# Patient Record
Sex: Female | Born: 1975 | Race: Black or African American | Hispanic: No | Marital: Single | Smoking: Former smoker
Health system: Southern US, Community
[De-identification: ages and names within clinical notes are randomized; demographics above are authoritative.]

## PROBLEM LIST (undated history)

## (undated) ENCOUNTER — Inpatient Hospital Stay (HOSPITAL_COMMUNITY): Payer: Self-pay

## (undated) DIAGNOSIS — Z8619 Personal history of other infectious and parasitic diseases: Secondary | ICD-10-CM

## (undated) DIAGNOSIS — F419 Anxiety disorder, unspecified: Secondary | ICD-10-CM

## (undated) DIAGNOSIS — D649 Anemia, unspecified: Secondary | ICD-10-CM

## (undated) HISTORY — DX: Personal history of other infectious and parasitic diseases: Z86.19

## (undated) HISTORY — DX: Anemia, unspecified: D64.9

## (undated) HISTORY — DX: Anxiety disorder, unspecified: F41.9

---

## 2002-05-16 ENCOUNTER — Ambulatory Visit (HOSPITAL_COMMUNITY): Admission: RE | Admit: 2002-05-16 | Discharge: 2002-05-16 | Payer: Self-pay | Admitting: Family Medicine

## 2002-05-16 ENCOUNTER — Encounter: Payer: Self-pay | Admitting: Family Medicine

## 2002-12-01 ENCOUNTER — Other Ambulatory Visit: Admission: RE | Admit: 2002-12-01 | Discharge: 2002-12-01 | Payer: Self-pay | Admitting: Family Medicine

## 2002-12-02 ENCOUNTER — Other Ambulatory Visit: Admission: RE | Admit: 2002-12-02 | Discharge: 2002-12-02 | Payer: Self-pay | Admitting: Family Medicine

## 2003-04-09 ENCOUNTER — Emergency Department (HOSPITAL_COMMUNITY): Admission: EM | Admit: 2003-04-09 | Discharge: 2003-04-09 | Payer: Self-pay | Admitting: Emergency Medicine

## 2003-05-29 ENCOUNTER — Other Ambulatory Visit: Admission: RE | Admit: 2003-05-29 | Discharge: 2003-05-29 | Payer: Self-pay | Admitting: Obstetrics and Gynecology

## 2004-01-13 ENCOUNTER — Emergency Department (HOSPITAL_COMMUNITY): Admission: EM | Admit: 2004-01-13 | Discharge: 2004-01-13 | Payer: Self-pay | Admitting: Emergency Medicine

## 2004-02-15 ENCOUNTER — Emergency Department (HOSPITAL_COMMUNITY): Admission: EM | Admit: 2004-02-15 | Discharge: 2004-02-15 | Payer: Self-pay | Admitting: Emergency Medicine

## 2004-05-03 ENCOUNTER — Emergency Department (HOSPITAL_COMMUNITY): Admission: EM | Admit: 2004-05-03 | Discharge: 2004-05-03 | Payer: Self-pay | Admitting: Emergency Medicine

## 2004-05-04 ENCOUNTER — Emergency Department (HOSPITAL_COMMUNITY): Admission: EM | Admit: 2004-05-04 | Discharge: 2004-05-04 | Payer: Self-pay | Admitting: Emergency Medicine

## 2004-06-26 ENCOUNTER — Other Ambulatory Visit: Admission: RE | Admit: 2004-06-26 | Discharge: 2004-06-26 | Payer: Self-pay | Admitting: Obstetrics and Gynecology

## 2004-07-11 ENCOUNTER — Emergency Department (HOSPITAL_COMMUNITY): Admission: EM | Admit: 2004-07-11 | Discharge: 2004-07-11 | Payer: Self-pay | Admitting: Emergency Medicine

## 2005-03-29 ENCOUNTER — Emergency Department (HOSPITAL_COMMUNITY): Admission: EM | Admit: 2005-03-29 | Discharge: 2005-03-29 | Payer: Self-pay | Admitting: Emergency Medicine

## 2006-08-10 ENCOUNTER — Inpatient Hospital Stay (HOSPITAL_COMMUNITY): Admission: AD | Admit: 2006-08-10 | Discharge: 2006-08-10 | Payer: Self-pay | Admitting: Obstetrics & Gynecology

## 2007-11-29 ENCOUNTER — Emergency Department (HOSPITAL_COMMUNITY): Admission: EM | Admit: 2007-11-29 | Discharge: 2007-11-29 | Payer: Self-pay | Admitting: Emergency Medicine

## 2008-02-23 LAB — CONVERTED CEMR LAB: Pap Smear: NORMAL

## 2008-05-23 ENCOUNTER — Emergency Department (HOSPITAL_COMMUNITY): Admission: EM | Admit: 2008-05-23 | Discharge: 2008-05-23 | Payer: Self-pay | Admitting: Emergency Medicine

## 2008-08-01 ENCOUNTER — Encounter (INDEPENDENT_AMBULATORY_CARE_PROVIDER_SITE_OTHER): Payer: Self-pay | Admitting: Family Medicine

## 2008-08-01 ENCOUNTER — Ambulatory Visit: Payer: Self-pay | Admitting: Vascular Surgery

## 2008-08-01 ENCOUNTER — Ambulatory Visit (HOSPITAL_COMMUNITY): Admission: RE | Admit: 2008-08-01 | Discharge: 2008-08-01 | Payer: Self-pay | Admitting: Family Medicine

## 2008-09-04 ENCOUNTER — Ambulatory Visit (HOSPITAL_COMMUNITY): Admission: RE | Admit: 2008-09-04 | Discharge: 2008-09-04 | Payer: Self-pay | Admitting: Family Medicine

## 2009-01-15 ENCOUNTER — Emergency Department (HOSPITAL_COMMUNITY): Admission: EM | Admit: 2009-01-15 | Discharge: 2009-01-15 | Payer: Self-pay | Admitting: Emergency Medicine

## 2009-01-15 ENCOUNTER — Emergency Department (HOSPITAL_COMMUNITY): Admission: EM | Admit: 2009-01-15 | Discharge: 2009-01-16 | Payer: Self-pay | Admitting: Emergency Medicine

## 2009-07-19 ENCOUNTER — Emergency Department (HOSPITAL_COMMUNITY): Admission: EM | Admit: 2009-07-19 | Discharge: 2009-07-19 | Payer: Self-pay | Admitting: Emergency Medicine

## 2009-12-05 ENCOUNTER — Encounter (INDEPENDENT_AMBULATORY_CARE_PROVIDER_SITE_OTHER): Payer: Self-pay | Admitting: *Deleted

## 2010-01-11 ENCOUNTER — Ambulatory Visit: Payer: Self-pay | Admitting: Internal Medicine

## 2010-01-11 DIAGNOSIS — R1319 Other dysphagia: Secondary | ICD-10-CM

## 2010-01-11 DIAGNOSIS — R011 Cardiac murmur, unspecified: Secondary | ICD-10-CM

## 2010-01-11 LAB — CONVERTED CEMR LAB
Bilirubin Urine: NEGATIVE
Blood in Urine, dipstick: NEGATIVE
Glucose, Bld: 92 mg/dL
Glucose, Urine, Semiquant: NEGATIVE
Ketones, urine, test strip: NEGATIVE
Nitrite: NEGATIVE
Protein, U semiquant: NEGATIVE
Specific Gravity, Urine: <1.005
Urobilinogen, UA: 0.2
WBC Urine, dipstick: NEGATIVE
pH: 5

## 2010-01-14 ENCOUNTER — Telehealth (INDEPENDENT_AMBULATORY_CARE_PROVIDER_SITE_OTHER): Payer: Self-pay | Admitting: *Deleted

## 2010-01-15 ENCOUNTER — Ambulatory Visit: Payer: Self-pay | Admitting: Internal Medicine

## 2010-01-15 ENCOUNTER — Encounter (INDEPENDENT_AMBULATORY_CARE_PROVIDER_SITE_OTHER): Payer: Self-pay | Admitting: *Deleted

## 2010-01-15 DIAGNOSIS — E119 Type 2 diabetes mellitus without complications: Secondary | ICD-10-CM

## 2010-01-15 LAB — CONVERTED CEMR LAB: Hgb A1c MFr Bld: 6 % (ref 4.6–6.5)

## 2010-01-18 ENCOUNTER — Encounter (INDEPENDENT_AMBULATORY_CARE_PROVIDER_SITE_OTHER): Payer: Self-pay | Admitting: *Deleted

## 2010-01-18 ENCOUNTER — Ambulatory Visit: Payer: Self-pay | Admitting: Gastroenterology

## 2010-01-18 DIAGNOSIS — E669 Obesity, unspecified: Secondary | ICD-10-CM

## 2010-01-23 ENCOUNTER — Telehealth: Payer: Self-pay | Admitting: Gastroenterology

## 2010-02-05 ENCOUNTER — Telehealth: Payer: Self-pay | Admitting: Gastroenterology

## 2010-02-13 ENCOUNTER — Encounter: Payer: Self-pay | Admitting: Gastroenterology

## 2010-03-11 ENCOUNTER — Encounter: Admission: RE | Admit: 2010-03-11 | Discharge: 2010-05-24 | Payer: Self-pay | Admitting: Internal Medicine

## 2010-03-12 ENCOUNTER — Encounter: Payer: Self-pay | Admitting: Internal Medicine

## 2010-09-24 NOTE — Letter (Signed)
Summary: Work Dietitian at [employer]  [address] [place], [place] [address]   Phone: [phone]  Fax: [phone]    Today's Date: Jan 15, 2010  Name of Patient: Haley Owens  The above named patient had a medical visit today at:  am / pm.  Please take this into consideration when reviewing the time away from work/school.    Special Instructions:  [  ] None  [ x ] To be off the remainder of today, returning to the normal work / school schedule tomorrow.  [  ] To be off until the next scheduled appointment on ______________________.  [  ] Other ________________________________________________________________ ________________________________________________________________________   Sincerely yours,   Shary Decamp

## 2010-09-24 NOTE — Assessment & Plan Note (Signed)
Summary: COUGHING, HOARSE/ALR   Vital Signs:  Patient profile:   35 year old female Height:      61 inches Weight:      202 pounds Temp:     97.9 degrees F BP sitting:   122 / 80  Vitals Entered By: Shary Decamp (Jan 15, 2010 1:29 PM) [facility]: cough, (productive), hoarse, nasal congestion, ears feel full   History of Present Illness: symptoms started 3 days ago with sore throat and mild cough Nose congestion Yesterday she experience some ear discomfort, right worse than left   Allergies: No Known Drug Allergies  Past History:  Past Medical History: per pt hx of heart murmur G7 P3 ab 4 Diabetes mellitus, type II  (hemoglobin A1c 6.0 on 12/2009)  Social History: Reviewed history from 01/11/2010 and no changes required. Single 3 children Tobacco use:  quit Drug use:  no Caffeine use:  2 drinks per day Alcohol use:  yes,  socially Exercise:  yes   Times per week:  3  Review of Systems General:  Denies fever. ENT:  Denies sore throat; (+) nasal d/c , green . Resp:  Denies shortness of breath and wheezing. GI:  Denies nausea and vomiting. MS:  Denies muscle aches.  Physical Exam  General:  alert and well-developed.   Ears:  both TMs slightly red, no bulge Nose:  slightly congested Mouth:  no redness or discharge Lungs:  normal respiratory effort, no intercostal retractions, and no accessory muscle use.  few rhonchi with cough, no respiratory distress   Impression & Recommendations:  Problem # 1:  BRONCHITIS- ACUTE (ICD-466.0)  see instructions  Her updated medication list for this problem includes:    Zithromax Z-pak 250 Mg Tabs (Azithromycin) .Marland Kitchen... As directed  Problem # 2:  DIABETES MELLITUS, TYPE II (ICD-250.00) recent DX of DM base and a hemoglobin A1c of 6.0 we'll see a nutritionist in the near future  Complete Medication List: 1)  Zithromax Z-pak 250 Mg Tabs (Azithromycin) .... As directed  Patient Instructions: 1)  rest, fluids, Mucinex DM as  needed for cough 2)  Start a Z-Pak 3)  Call if not better in a few days Prescriptions: ZITHROMAX Z-PAK 250 MG TABS (AZITHROMYCIN) as directed  #1 x 0   Entered and Authorized by:   Nolon Rod. Shalimar Mcclain MD   Signed by:   Nolon Rod. Husayn Reim MD on 01/15/2010   Method used:   Print then Give to Patient   RxID:   [id number]

## 2010-09-24 NOTE — Assessment & Plan Note (Signed)
Summary: new pt/[employer]et[employer]/ns/kdc   Vital Signs:  Patient profile:   35 year old female Height:      61 inches Weight:      201.6 pounds BMI:     38.23 Pulse rate:   60 / minute BP sitting:   120 / 84  Vitals Entered By: Shary Decamp (Jan 11, 2010 12:53 PM) CC: new pt  - states that eye doctor stated that she needs to be checked for DM because she has "a leakage behind her eye".  c/o of numbnes in fingers   History of Present Illness: new patient had a regular eye check, they found "a leakege" : diabetic vs congenital, was rec to be checked   Preventive Screening-Counseling & Management  Alcohol-Tobacco     Smoking Status: quit  Caffeine-Diet-Exercise     Caffeine use/day: 2     Does Patient Exercise: yes     Times/week: 3      Drug Use:  no.    Current Medications (verified): 1)  None  Allergies (verified): No Known Drug Allergies  Past History:  Past Medical History: per pt hx of heart murmur G7 P3 ab 4 Diabetes mellitus, type II  Past Surgical History: Denies surgical history  Family History: F - hx unknown M - living elevated chol, HTN DM - MGP arthritis - GP Prostate Ca - MGF  Social History: Single 3 children Tobacco use:  quit Drug use:  no Caffeine use:  2 drinks per day Alcohol use:  yes,  socially Exercise:  yes   Times per week:  3 Smoking Status:  quit Drug Use:  no Does Patient Exercise:  yes Caffeine use/day:  2  Review of Systems Eyes:  occasionally blurred vision . GI:  x years she chokes from time to time, no odynophagia, no GERD symptoms . Endo:  Denies excessive urination; always thirsty and hungry recent wt gain.  Physical Exam  General:  alert, well-developed, and overweight-appearing.   Neck:  no masses and no thyromegaly.   Lungs:  normal respiratory effort, no intercostal retractions, no accessory muscle use, and normal breath sounds.   Heart:  normal rate, regular rhythm, and no murmur.   Extremities:  no pretibial  edema bilaterally    Impression & Recommendations:  Problem # 1:  ? of DIABETES MELLITUS, TYPE II (ICD-250.00) eye lesion may be related to DM CBG normal will check a A1C  Orders: Fingerstick (36416) Glucose, (CBG) ([address]) UA Dipstick w/o Micro (manual) (60[address]) Venipuncture (36415) TLB-A1C / Hgb A1C (Glycohemoglobin) (83036-A1C)  Problem # 2:  Hx of CARDIAC MURMUR (ICD-785.2) not able to appreciate a murmur today  Problem # 3:  OTHER DYSPHAGIA (ICD-787.29) symptoms going on x years, GI referal Vs wait and see if the symptoms increase  CXR addendum: Patient quite concerned about this symptom, but states that his face is getting worse over time: Refer to GI Orders: T-2 View CXR (71020TC) Gastroenterology Referral (GI)  Laboratory Results   Urine Tests    Routine Urinalysis   Glucose: negative   (Normal Range: Negative) Bilirubin: negative   (Normal Range: Negative) Ketone: negative   (Normal Range: Negative) Spec. Gravity: <1.005   (Normal Range: 1.003-1.035) Blood: negative   (Normal Range: Negative) pH: 5.0   (Normal Range: 5.0-8.0) Protein: negative   (Normal Range: Negative) Urobilinogen: 0.2   (Normal Range: 0-1) Nitrite: negative   (Normal Range: Negative) Leukocyte Esterace: negative   (Normal Range: Negative)     Blood Tests  Glucose (random): 92 mg/dL   (Normal Range: 66-440)       Preventive Care Screening  Pap Smear:    Date:  02/23/2008    Results:  normal   Last Tetanus Booster:    Date:  08/25/2000    Results:  Td     Risk Factors:  Tobacco use:  quit Drug use:  no Caffeine use:  2 drinks per day Alcohol use:  yes    Comments:  socially Exercise:  yes    Times per week:  3  PAP Smear History:     Date of Last PAP Smear:  02/23/2008    Results:  normal

## 2010-09-24 NOTE — Medication Information (Signed)
Summary: Dexilant DENIED/[place] Medicaid  Dexilant DENIED/[place] Medicaid   Imported By: Sherian Rein 02/28/2010 07:45:45  _____________________________________________________________________  External Attachment:    Type:   Image     Comment:   External Document

## 2010-09-24 NOTE — Letter (Signed)
Summary: New Patient Letter  [facility] [place]  [address] [place], [place] [address]   Phone: [phone]  Fax: [phone]       12/05/2009 MRN: [id number]  TUWANA KAPAUN [address] [place], [place]  [address]  Dear [name],   Welcome to [facility] and thank you for choosing [place] as your Primary Care Providers. Enclosed you will find information about our practice that we hope you find helpful. We have also enclosed forms to be filled out prior to your visit. This will provide [place] with the necessary information and facilitate your being seen in a timely manner. If you have any questions, please call us at:  [phone]        and we will be happy to assist you. We look forward to seeing you at your scheduled appointment time.  Appointment   MAY [phone] @ 12:40PM            with Dr.  Drue Novel               Sincerely,  Primary Health Care Team  Please arrive 15 minutes early for your first appointment and bring your insurance card. Co-pay is required at the time of your visit.  *****Please call the office if you are not able to keep this appointment. There is a charge of $50.00 if any appointment is not cancelled or rescheduled within 24 hours.

## 2010-09-24 NOTE — Progress Notes (Signed)
Summary: lab results, [facility] 5/23 pt called back  Phone Note Outgoing Call Call back at Home Phone [phone] Call back at Work Phone [phone]   Reason for Call: Discuss lab or test results Details for Reason: advise patient: she does have borderline DM send labs to ophtalmology refer to a nutritionist start a exercise program: at least 30 min of daily walking RTC 4 months  Signed by [facility] E. Paz MD on 01/13/2010 at 7:45 PM Summary of Call: left message on machine for pt to return call.........Marland KitchenShary Decamp  Jan 14, 2010 1:21 PM left message on machine for pt to return call.........Marland KitchenShary Decamp  Jan 14, 2010 4:03 PM    Follow-up for Phone Call        Pt called back informed of labwork, Dr Drue Novel recommendations  and nutrition referral. C/O cough and hoarse, ov scheduled for tomorrow .Kandice Hams  Jan 14, 2010 4:12 PM  Follow-up by: Kandice Hams,  Jan 14, 2010 4:12 PM     Appended Document: lab results, [facility] 5/23 pt called back pt is to call our office with name of ophtalmologist so we can fax labs

## 2010-09-24 NOTE — Letter (Signed)
Summary: New Patient letter  [facility]  [address] [place], [place] [address]   Phone: [phone]  Fax: [phone]       01/15/2010 MRN: [id number]  Haley Owens [address] [place], [place]  [address]  Dear Haley Owens,  Welcome to the Gastroenterology Division at [facility].    You are scheduled to see Dr.  Sheryn Bison on Jan 18, 2010 at 9:30am on the 3rd floor at [facility], [address]  We ask that you try to arrive at our office 15 minutes prior to your appointment time to allow for check-in.  We would like you to complete the enclosed self-administered evaluation form prior to your visit and bring it with you on the day of your appointment.  We will review it with you.  Also, please bring a complete list of all your medications or, if you prefer, bring the medication bottles and we will list them.  Please bring your insurance card so that we may make a copy of it.  If your insurance requires a referral to see a specialist, please bring your referral form from your primary care physician.  Co-payments are due at the time of your visit and may be paid by cash, check or credit card.     Your office visit will consist of a consult with your physician (includes a physical exam), any laboratory testing he/she may order, scheduling of any necessary diagnostic testing (e.g. x-ray, ultrasound, CT-scan), and scheduling of a procedure (e.g. Endoscopy, Colonoscopy) if required.  Please allow enough time on your schedule to allow for any/all of these possibilities.    If you cannot keep your appointment, please call [phone] to cancel or reschedule prior to your appointment date.  This allows [place] the opportunity to schedule an appointment for another patient in need of care.  If you do not cancel or reschedule by 5 p.m. the business day prior to your appointment date, you will be charged a $50.00 late cancellation/no-show fee.    Thank you for  choosing [facility] for your medical needs.  We appreciate the opportunity to care for you.  Please visit [place] at our website  to learn more about our practice.                     Sincerely,                                                             The Gastroenterology Division

## 2010-09-24 NOTE — Progress Notes (Signed)
Summary: Prior Authorization for Dexilant   Phone Note Outgoing Call Call back at [facility] Phone [phone]   Call placed by: Ok Anis CMA,  January 23, 2010 9:23 AM Call placed to: Patient Summary of Call: I called patient because we received a prior authorization from her pharmacy but  the pharmacy has her primary insurance as Medicaid and we have [employer] in our system.  I left message with patient to call us back. I need to advice patient that she needs to contact her pharmacy and give correct insurance information

## 2010-09-24 NOTE — Letter (Signed)
Summary: EGD Instructions  [facility]  [address] [place], [place] [address]   Phone: [phone]  Fax: [phone]       VERDELLE VALTIERRA    [birth year]    MRN: [id number]       Procedure Day /Date: Friday, 02/01/10     Arrival Time:  2:30     Procedure Time: 3:30     Location of Procedure:                    _ X _ [facility] (4th Floor)     PREPARATION FOR ENDOSCOPY   On 02/01/10 THE DAY OF THE PROCEDURE:  1.   No solid foods, milk or milk products are allowed after midnight the night before your procedure.  2.   Do not drink anything colored red or purple.  Avoid juices with pulp.  No orange juice.  3.  You may drink clear liquids until 1:30, which is 2 hours before your procedure.                                                                                                CLEAR LIQUIDS INCLUDE: Water Jello Ice Popsicles Tea (sugar ok, no milk/cream) Powdered fruit flavored drinks Coffee (sugar ok, no milk/cream) Gatorade Juice: apple, white grape, white cranberry  Lemonade Clear bullion, consomm, broth Carbonated beverages (any kind) Strained chicken noodle soup Hard Candy   MEDICATION INSTRUCTIONS  Unless otherwise instructed, you should take regular prescription medications with a small sip of water as early as possible the morning of your procedure.                        OTHER INSTRUCTIONS  You will need a responsible adult at least 37 years of age to accompany you and drive you home.   This person must remain in the waiting room during your procedure.  Wear loose fitting clothing that is easily removed.  Leave jewelry and other valuables at home.  However, you may wish to bring a book to read or an iPod/MP3 player to listen to music as you wait for your procedure to start.  Remove all body piercing jewelry and leave at home.  Total time from sign-in until discharge is approximately 2-3 hours.  You  should go home directly after your procedure and rest.  You can resume normal activities the day after your procedure.  The day of your procedure you should not:   Drive   Make legal decisions   Operate machinery   Drink alcohol   Return to work  You will receive specific instructions about eating, activities and medications before you leave.    The above instructions have been reviewed and explained to me by   _______________________    I fully understand and can verbalize these instructions _____________________________ Date _________

## 2010-09-24 NOTE — Letter (Signed)
Summary: [facility]  [facility]   Imported By: Lanelle Bal 03/21/2010 08:24:28  _____________________________________________________________________  External Attachment:    Type:   Image     Comment:   External Document

## 2010-09-24 NOTE — Assessment & Plan Note (Signed)
Summary: dysphagia,em   History of Present Illness Visit Type: consult  Primary GI MD: Sheryn Bison MD FACP FAGA Primary Provider: Willow Ora, MD Requesting Provider: Willow Ora, MD Chief Complaint: dysphagia and constipation  History of Present Illness:   Extremely Pleasant 35 year old female with intermittent solid food dysphagia over the last year without reflux symptoms or other gastrointestinal symptomatology. She does give a history consistent with Raynaud's phenomenon, but no other symptoms of collagen-vascular disease. She denies any other neuromuscular or bladder emptying problems. She has not had a definite impaction, but eats very slowly. She denies constipation, melena, hematochezia, or any hepatobiliary complaints. She does have a past history of anxiety disorder and borderline diabetes. She currently is on azithromycin because of bronchitis.  She previously smoked heavily but has not smoked in several months. She denies a history of ethanol abuse. Family history is noncontributory.   GI Review of Systems    Reports dysphagia with solids.      Denies abdominal pain, acid reflux, belching, bloating, chest pain, dysphagia with liquids, heartburn, loss of appetite, nausea, vomiting, vomiting blood, weight loss, and  weight gain.      Reports change in bowel habits and  constipation.     Denies anal fissure, black tarry stools, diarrhea, diverticulosis, fecal incontinence, heme positive stool, hemorrhoids, irritable bowel syndrome, jaundice, light color stool, liver problems, rectal bleeding, and  rectal pain.    Current Medications (verified): 1)  Zithromax Z-Pak 250 Mg Tabs (Azithromycin) .... As Directed  Allergies (verified): No Known Drug Allergies  Past History:  Past medical, surgical, family and social histories (including risk factors) reviewed for relevance to current acute and chronic problems.  Past Medical History: per pt hx of heart murmur G7 P3 ab 4 Diabetes  mellitus, type II  (hemoglobin A1c 6.0 on 12/2009) Anemia Anxiety Disorder  Past Surgical History: Reviewed history from 01/11/2010 and no changes required. Denies surgical history  Family History: Reviewed history from 01/11/2010 and no changes required. 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  The patient denies allergy/sinus, anemia, anxiety-new, arthritis/joint pain, back pain, blood in urine, breast changes/lumps, change in vision, confusion, cough, coughing up blood, depression-new, fainting, fatigue, fever, headaches-new, hearing problems, heart murmur, heart rhythm changes, itching, menstrual pain, muscle pains/cramps, night sweats, nosebleeds, pregnancy symptoms, shortness of breath, skin rash, sleeping problems, sore throat, swelling of feet/legs, swollen lymph glands, thirst - excessive , urination - excessive , urination changes/pain, urine leakage, vision changes, and voice change.         no history of menstrual irregularity or missed periods.  Vital Signs:  Patient profile:   35 year old female Height:      61 inches Weight:      202 pounds BMI:     38.31 BSA:     1.90 Pulse rate:   76 / minute Pulse rhythm:   regular BP sitting:   128 / 72  (left arm) Cuff size:   regular  Vitals Entered By: Ok Anis CMA (Jan 18, 2010 9:35 AM)  Physical Exam  General:  Well developed, well nourished, no acute distress.healthy appearing and obese.   Head:  Normocephalic and atraumatic. Eyes:  PERRLA, no icterus.exam deferred to patient's ophthalmologist.   Mouth:  No deformity or lesions,  dentition normal. Neck:  Thyroid is palpable but nonnodular nontender. Lungs:  Clear throughout to auscultation. Heart:  Regular rate and rhythm; no murmurs, rubs,  or bruits. Abdomen:  Soft, nontender and nondistended. No masses, hepatosplenomegaly or hernias noted. Normal bowel sounds. Pulses:  Normal pulses noted. Extremities:  No clubbing, cyanosis, edema or deformities noted.Her digits do not appear cyanotic or cold. Neurologic:  Alert and  oriented x4;  grossly normal neurologically. Cervical Nodes:  No significant cervical adenopathy. Psych:  Alert and cooperative. Normal mood and affect.   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 She does have a history suggestive of Raynaud's phenomenon which is associated with acid reflux, LES incompetency, and distal esophageal dysmotility. I have scheduled her for endoscopy with probable dilation, and I placed her on daily PPI therapy empirically.  Problem # 2:  DIABETES MELLITUS, TYPE II (ICD-250.00) Assessment: Unchanged  Problem # 3:  OBESITY, UNSPECIFIED (ICD-278.00) Assessment: Unchanged BMI today is 38.31, and she weighs 202 pounds.  Patient Instructions: 1)  Begin Dexilant once daily.  2)  You are scheduled for an upper endoscopy. 3)  Please continue current medications.  4)  The medication list was reviewed and reconciled.  All changed / newly prescribed medications were explained.  A complete medication list was provided to the patient / caregiver. 5)  Copy sent to : Dr. Porfirio Oar  Appended Document: dysphagia,em    Clinical Lists Changes  Medications: Added new medication of DEXILANT 60 MG CPDR (DEXLANSOPRAZOLE) 1 by mouth once daily - Signed Rx of DEXILANT 60 MG CPDR (DEXLANSOPRAZOLE) 1 by mouth once daily;  #30 x 6;  Signed;  Entered by: Ashok Cordia RN;  Authorized by: Mardella Layman MD [facility];  Method used: [facility]  [address] [id number] [address], [place], [place]  [address], Ph: [phone] or [phone], Fax: [phone] Orders: Added new Test order of EGD SAV (EGD SAV) - Signed    Prescriptions: DEXILANT 60 MG CPDR (DEXLANSOPRAZOLE) 1 by mouth once daily  #30 x 6   Entered by:   Ashok Cordia RN   Authorized by:   Mardella Layman MD [facility]   Signed by:   Ashok Cordia RN on 01/18/2010   Method used:   [facility]        [facility]  [address] [id number]ail[facility]       [address]       [place], [place]  [address]       Ph: [phone] or [phone]       Fax: [phone]   RxID:   [id number]

## 2010-09-24 NOTE — Progress Notes (Signed)
  Phone Note Outgoing Call   Call placed by: Ok Anis CMA,  February 05, 2010 2:08 PM Call placed to: Patient Summary of Call: I have left several messages for patient to call office back. I cannot get prior authorization done until I know what type of  insurance patient has and she has to give correct insurance to the pharmacy so that they can run the RX.     Appended Document: New PPI med sent [employer] at [phone] they will only pay for Omeprazole two times a day after 30 days if not effective we can resubmit Dexilant. Merri Ray CMA Duncan Dull)  February 12, 2010 8:31 AM    Clinical Lists Changes  Medications: Changed medication from DEXILANT 60 MG CPDR (DEXLANSOPRAZOLE) 1 by mouth once daily to OMEPRAZOLE 20 MG CPDR (OMEPRAZOLE) 1 by mouth 30 minutes before breakfast and 1 at bedtime - Signed Rx of OMEPRAZOLE 20 MG CPDR (OMEPRAZOLE) 1 by mouth 30 minutes before breakfast and 1 at bedtime;  #60 x 3;  Signed;  Entered by: Merri Ray CMA (AAMA);  Authorized by: Mardella Layman MD [facility];  Method used: [facility]  [address] [id number] [address] [place], [place], [place]  [address], Ph: [phone] or [phone], Fax: [phone]    Prescriptions: OMEPRAZOLE 20 MG CPDR (OMEPRAZOLE) 1 by mouth 30 minutes before breakfast and 1 at bedtime  #60 x 3   Entered by:   Merri Ray CMA (AAMA)   Authorized by:   Mardella Layman MD [facility]   Signed by:   Merri Ray CMA (AAMA) on 02/12/2010   Method used:   [facility]        [facility]  [address] [id number]       [address]       [place], [place]  [address]       Ph: [phone] or [phone]       Fax: [phone]   RxID:   [id number]    Appended Document:  Contacted pt to inform, L/M for pt

## 2010-12-03 LAB — URINALYSIS, ROUTINE W REFLEX MICROSCOPIC
Bilirubin Urine: NEGATIVE
Bilirubin Urine: NEGATIVE
Glucose, UA: NEGATIVE mg/dL
Glucose, UA: NEGATIVE mg/dL
Hgb urine dipstick: NEGATIVE
Hgb urine dipstick: NEGATIVE
Ketones, ur: NEGATIVE mg/dL
Ketones, ur: NEGATIVE mg/dL
Nitrite: NEGATIVE
Nitrite: NEGATIVE
Protein, ur: NEGATIVE mg/dL
Protein, ur: NEGATIVE mg/dL
Specific Gravity, Urine: 1.016 (ref 1.005–1.030)
Specific Gravity, Urine: 1.019 (ref 1.005–1.030)
Urobilinogen, UA: 0.2 mg/dL (ref 0.0–1.0)
Urobilinogen, UA: 0.2 mg/dL (ref 0.0–1.0)
pH: 5.5 (ref 5.0–8.0)
pH: 7.5 (ref 5.0–8.0)

## 2010-12-03 LAB — POCT I-STAT, CHEM 8
BUN: 8 mg/dL (ref 6–23)
Calcium, Ion: 1.16 mmol/L (ref 1.12–1.32)
Chloride: 104 mEq/L (ref 96–112)
Creatinine, Ser: 1.2 mg/dL (ref 0.4–1.2)
Glucose, Bld: 103 mg/dL — ABNORMAL HIGH (ref 70–99)
HCT: 40 % (ref 36.0–46.0)
Hemoglobin: 13.6 g/dL (ref 12.0–15.0)
Potassium: 3.6 mEq/L (ref 3.5–5.1)
Sodium: 138 mEq/L (ref 135–145)
TCO2: 24 mmol/L (ref 0–100)

## 2010-12-03 LAB — CBC
HCT: 36.7 % (ref 36.0–46.0)
Hemoglobin: 11.6 g/dL — ABNORMAL LOW (ref 12.0–15.0)
MCHC: 31.4 g/dL (ref 30.0–36.0)
MCV: 77.3 fL — ABNORMAL LOW (ref 78.0–100.0)
Platelets: 217 10*3/uL (ref 150–400)
RBC: 4.75 MIL/uL (ref 3.87–5.11)
RDW: 16 % — ABNORMAL HIGH (ref 11.5–15.5)
WBC: 8 10*3/uL (ref 4.0–10.5)

## 2010-12-03 LAB — DIFFERENTIAL
Basophils Absolute: 0 10*3/uL (ref 0.0–0.1)
Basophils Relative: 0 % (ref 0–1)
Eosinophils Absolute: 0.1 10*3/uL (ref 0.0–0.7)
Eosinophils Relative: 2 % (ref 0–5)
Lymphocytes Relative: 25 % (ref 12–46)
Lymphs Abs: 2 10*3/uL (ref 0.7–4.0)
Monocytes Absolute: 0.5 10*3/uL (ref 0.1–1.0)
Monocytes Relative: 7 % (ref 3–12)
Neutro Abs: 5.3 10*3/uL (ref 1.7–7.7)
Neutrophils Relative %: 66 % (ref 43–77)

## 2010-12-03 LAB — POCT PREGNANCY, URINE
Preg Test, Ur: NEGATIVE
Preg Test, Ur: NEGATIVE

## 2010-12-03 LAB — D-DIMER, QUANTITATIVE: D-Dimer, Quant: 0.52 ug/mL-FEU — ABNORMAL HIGH (ref 0.00–0.48)

## 2011-02-14 ENCOUNTER — Encounter: Payer: Self-pay | Admitting: Internal Medicine

## 2011-02-28 ENCOUNTER — Emergency Department (HOSPITAL_COMMUNITY)
Admission: EM | Admit: 2011-02-28 | Discharge: 2011-02-28 | Disposition: A | Discharge status: Home / Self Care | Payer: Managed Care, Other (non HMO) | Attending: Emergency Medicine | Admitting: Emergency Medicine

## 2011-02-28 ENCOUNTER — Emergency Department (HOSPITAL_COMMUNITY): Payer: Managed Care, Other (non HMO)

## 2011-02-28 DIAGNOSIS — R071 Chest pain on breathing: Secondary | ICD-10-CM | POA: Insufficient documentation

## 2011-02-28 DIAGNOSIS — R079 Chest pain, unspecified: Secondary | ICD-10-CM | POA: Insufficient documentation

## 2011-03-05 ENCOUNTER — Encounter: Payer: Self-pay | Admitting: Internal Medicine

## 2011-03-05 ENCOUNTER — Ambulatory Visit (INDEPENDENT_AMBULATORY_CARE_PROVIDER_SITE_OTHER): Payer: Medicaid Other | Admitting: Internal Medicine

## 2011-03-05 DIAGNOSIS — R1319 Other dysphagia: Secondary | ICD-10-CM

## 2011-03-05 DIAGNOSIS — Z23 Encounter for immunization: Secondary | ICD-10-CM

## 2011-03-05 DIAGNOSIS — E119 Type 2 diabetes mellitus without complications: Secondary | ICD-10-CM

## 2011-03-05 DIAGNOSIS — R1012 Left upper quadrant pain: Secondary | ICD-10-CM | POA: Insufficient documentation

## 2011-03-05 DIAGNOSIS — Z0000 Encounter for general adult medical examination without abnormal findings: Secondary | ICD-10-CM

## 2011-03-05 NOTE — Assessment & Plan Note (Signed)
GI recommended at EGD, I think that is indicated, I share my opinion with the patient, encouraged to call GI

## 2011-03-05 NOTE — Assessment & Plan Note (Signed)
Diet and exercise discussed, labs 

## 2011-03-05 NOTE — Assessment & Plan Note (Signed)
Td today Refer to gynecology Not on BCP or any other birth control system due to  issues with bleeding in the past. Diet and exercise discussed. Complains of "increased appetite", I think a low carb diet will definitely help. Offered a Publishing copy  Labs, see instructions

## 2011-03-05 NOTE — Progress Notes (Signed)
  Subjective:    Patient ID: Haley Owens, female    DOB: Jun 26, 1976, 35 y.o.   MRN: [id number]  HPI CPX Other issues: Needs followup on borderline diabetes, her A1c was 6.0 last year. Like something to "decrease her appetite", went to see another physician last year, apparently was prescribed phentermine, "that didn't help". 3 month history of on and off abdominal pain, she points to is a left upper quadrant, pain lasts for few days whenever it comes, does not change with eating or moving her torso. She had dysphagia, went to see GI, they recommended an EGD that she did not follow through. Since then she is "chewing slower", symptoms are less noticeable but not gone. She is not taking any PPIs, denies classic GERD symptoms.  Past Medical History  Diagnosis Date  . Diabetes mellitus     A1C 6.0 12-2009  . Anemia     h/o  . Anxiety     No past surgical history on file.    Family History: F - hx unknown Colon ca--no Breast ca--no elevated chol-- M HTN-- M DM - MGP arthritis - GP Prostate Ca - MGF  Social History: Job-- customer service  Single 3 children Tobacco use:  quit Drug use:  no Alcohol use:  yes,  socially Exercise: ~ 3 times a week     Review of Systems No fever or weight loss No nausea, vomiting, diarrhea or blood in the stools. No dysuria or gross hematuria No cough. 2 days ago went to the ER with anterior upper chest pain or shortness of breath, this was happening in the setting of anxiety and a crying spell, reportedly she had EKG which was okay and she was released. No further symptoms.    Objective:   Physical Exam  Constitutional: She is oriented to person, place, and time. She appears well-developed. No distress.       Overweight appearing  HENT:  Head: Normocephalic and atraumatic.  Eyes: No scleral icterus.  Neck: No thyromegaly present.  Cardiovascular: Normal rate, regular rhythm, normal heart sounds and intact distal pulses.   No  murmur heard. Pulmonary/Chest: Effort normal and breath sounds normal. No respiratory distress. She has no wheezes. She has no rales.  Abdominal: Soft. Bowel sounds are normal. She exhibits no distension. There is no tenderness. There is no rebound and no guarding.  Musculoskeletal: She exhibits no edema.  Neurological: She is alert and oriented to person, place, and time.  Skin: Skin is warm and dry. She is not diaphoretic.  Psychiatric: She has a normal mood and affect. Her behavior is normal. Judgment and thought content normal.          Assessment & Plan:

## 2011-03-05 NOTE — Assessment & Plan Note (Signed)
Abdominal pain of unclear etiology, to be sure we'll get ultrasound

## 2011-03-05 NOTE — Patient Instructions (Signed)
Came back fasting within few  days: CMP, TSH, FLP----dx V70 Hemoglobin A1c---- dx DM

## 2011-03-06 ENCOUNTER — Other Ambulatory Visit: Payer: Self-pay | Admitting: Internal Medicine

## 2011-03-06 DIAGNOSIS — E119 Type 2 diabetes mellitus without complications: Secondary | ICD-10-CM

## 2011-03-06 DIAGNOSIS — Z0000 Encounter for general adult medical examination without abnormal findings: Secondary | ICD-10-CM

## 2011-03-07 ENCOUNTER — Other Ambulatory Visit: Payer: Medicaid Other

## 2011-03-07 ENCOUNTER — Other Ambulatory Visit (INDEPENDENT_AMBULATORY_CARE_PROVIDER_SITE_OTHER): Payer: Medicaid Other

## 2011-03-07 ENCOUNTER — Other Ambulatory Visit: Payer: Self-pay | Admitting: Internal Medicine

## 2011-03-07 DIAGNOSIS — Z0000 Encounter for general adult medical examination without abnormal findings: Secondary | ICD-10-CM

## 2011-03-07 DIAGNOSIS — E119 Type 2 diabetes mellitus without complications: Secondary | ICD-10-CM

## 2011-03-10 LAB — COMPREHENSIVE METABOLIC PANEL
ALT: 26 U/L (ref 0–35)
Albumin: 4.5 g/dL (ref 3.5–5.2)
Alkaline Phosphatase: 39 U/L (ref 39–117)
Potassium: 4 mEq/L (ref 3.5–5.3)
Sodium: 136 mEq/L (ref 135–145)
Total Bilirubin: 0.6 mg/dL (ref 0.3–1.2)
Total Protein: 7.7 g/dL (ref 6.0–8.3)

## 2011-03-10 LAB — LIPID PANEL
LDL Cholesterol: 98 mg/dL (ref 0–99)
VLDL: 19 mg/dL (ref 0–40)

## 2011-03-11 LAB — HEMOGLOBIN A1C
Hgb A1c MFr Bld: 6.1 % — ABNORMAL HIGH (ref <5.7)
Mean Plasma Glucose: 128 mg/dL — ABNORMAL HIGH (ref <117)

## 2011-03-11 LAB — TSH: TSH: 0.865 u[IU]/mL (ref 0.350–4.500)

## 2011-03-12 ENCOUNTER — Telehealth: Payer: Self-pay | Admitting: *Deleted

## 2011-03-12 NOTE — Telephone Encounter (Signed)
Message copied by Leanne Lovely on Wed Mar 12, 2011  9:13 AM ------      Message from: Willow Ora E      Created: Tue Mar 11, 2011  5:53 PM       Advise patient      Borderline diabetes continued to be well controlled, hemoglobin A1c is 6.1.      Cholesterol is  excellent      All other labs normal. Good results

## 2011-03-12 NOTE — Telephone Encounter (Signed)
Message left for patient to return my call.  

## 2011-03-13 NOTE — Telephone Encounter (Signed)
Message left for patient to return my call.  

## 2011-03-14 NOTE — Telephone Encounter (Signed)
Pt is aware.  

## 2011-03-14 NOTE — Telephone Encounter (Signed)
Message left for patient to return my call.  

## 2011-03-17 NOTE — Progress Notes (Signed)
Labs only

## 2011-03-20 ENCOUNTER — Ambulatory Visit
Admission: RE | Admit: 2011-03-20 | Discharge: 2011-03-20 | Disposition: A | Discharge status: Home / Self Care | Payer: Medicaid Other | Source: Ambulatory Visit | Attending: Internal Medicine | Admitting: Internal Medicine

## 2011-03-20 DIAGNOSIS — R1012 Left upper quadrant pain: Secondary | ICD-10-CM

## 2011-03-25 ENCOUNTER — Telehealth: Payer: Self-pay | Admitting: *Deleted

## 2011-03-25 NOTE — Telephone Encounter (Signed)
Message left for patient to return my call.  

## 2011-03-25 NOTE — Telephone Encounter (Signed)
Message copied by Leanne Lovely on Tue Mar 25, 2011  9:34 AM ------      Message from: Wanda Plump      Created: Tue Mar 25, 2011  7:56 AM       Advise patient:      U/s normal

## 2011-03-25 NOTE — Telephone Encounter (Signed)
Pt is aware.  

## 2011-05-02 ENCOUNTER — Encounter: Payer: Medicaid Other | Admitting: Obstetrics and Gynecology

## 2011-05-29 ENCOUNTER — Encounter: Payer: Medicaid Other | Admitting: Obstetrics and Gynecology

## 2011-07-15 LAB — OB RESULTS CONSOLE ABO/RH: RH Type: POSITIVE

## 2011-07-15 LAB — OB RESULTS CONSOLE GC/CHLAMYDIA
Chlamydia: NEGATIVE
Gonorrhea: NEGATIVE

## 2011-07-15 LAB — OB RESULTS CONSOLE RPR: RPR: NONREACTIVE

## 2011-07-15 LAB — OB RESULTS CONSOLE HEPATITIS B SURFACE ANTIGEN: Hepatitis B Surface Ag: NEGATIVE

## 2011-07-15 LAB — OB RESULTS CONSOLE ANTIBODY SCREEN: Antibody Screen: NEGATIVE

## 2011-08-11 ENCOUNTER — Other Ambulatory Visit (HOSPITAL_COMMUNITY): Payer: Self-pay | Admitting: Obstetrics

## 2011-08-11 DIAGNOSIS — Z3682 Encounter for antenatal screening for nuchal translucency: Secondary | ICD-10-CM

## 2011-08-22 ENCOUNTER — Ambulatory Visit (HOSPITAL_COMMUNITY)
Admission: RE | Admit: 2011-08-22 | Discharge: 2011-08-22 | Disposition: A | Discharge status: Home / Self Care | Payer: 59 | Source: Ambulatory Visit | Attending: Obstetrics | Admitting: Obstetrics

## 2011-08-22 ENCOUNTER — Encounter (HOSPITAL_COMMUNITY): Payer: Self-pay

## 2011-08-22 ENCOUNTER — Other Ambulatory Visit (HOSPITAL_COMMUNITY): Payer: Self-pay | Admitting: Obstetrics

## 2011-08-22 DIAGNOSIS — Z3682 Encounter for antenatal screening for nuchal translucency: Secondary | ICD-10-CM

## 2011-08-22 DIAGNOSIS — O09529 Supervision of elderly multigravida, unspecified trimester: Secondary | ICD-10-CM | POA: Insufficient documentation

## 2011-08-22 DIAGNOSIS — Z3689 Encounter for other specified antenatal screening: Secondary | ICD-10-CM | POA: Insufficient documentation

## 2011-08-22 DIAGNOSIS — O351XX0 Maternal care for (suspected) chromosomal abnormality in fetus, not applicable or unspecified: Secondary | ICD-10-CM | POA: Insufficient documentation

## 2011-08-22 DIAGNOSIS — O3510X0 Maternal care for (suspected) chromosomal abnormality in fetus, unspecified, not applicable or unspecified: Secondary | ICD-10-CM | POA: Insufficient documentation

## 2011-08-22 DIAGNOSIS — O2620 Pregnancy care for patient with recurrent pregnancy loss, unspecified trimester: Secondary | ICD-10-CM | POA: Insufficient documentation

## 2011-08-22 NOTE — Progress Notes (Signed)
Genetic Counseling  High-Risk Gestation Note  Appointment Date:  08/22/2011 Referred By: Kathreen Cosier, MD Date of Birth:  07-09-1976  Pregnancy History: [id number] Estimated Date of Delivery: 02/27/12 Estimated Gestational Age: [redacted]w[redacted]d Attending: Particia Nearing, MD   [name] was seen for genetic counseling because of a maternal age of 35 y.o..     She was counseled regarding maternal age and the association with risk for chromosome conditions due to nondisjunction with aging of the ova.   We reviewed chromosomes, nondisjunction, and the associated 1 in 62 risk for fetal aneuploidy related to a maternal age of 35 y.o. at [redacted]w[redacted]d weeks gestation.  She was counseled that the risk for aneuploidy decreases as gestational age increases, accounting for those pregnancies which spontaneously abort.  We specifically discussed Down syndrome (trisomy 27), trisomies 43 and 64, and sex chromosome aneuploidies (47,XXX and 47,XXY) including the common features and prognoses of each.   We reviewed available screening and diagnostic options.  Regarding screening tests, we discussed the options of First screen and ultrasound.  She understands that screening tests are used to modify a patient's a priori risk for aneuploidy, typically based on age.  This estimate provides a pregnancy specific risk assessment.  We also reviewed the availability of diagnostic options including CVS and amniocentesis.  We discussed the risks, limitations, and benefits of each.  After reviewing these options, Ms. Barbour elected to have First trimester screen today, but declined CVS and amniocentesis.  Ultrasound was performed at the time of today's visit. Complete ultrasound results reported separately. She wishes to pursue these options to help ascertain her pregnancy specific risks for aneuploidy and will consider amniocentesis pending the results of this testing.  She understands that ultrasound cannot rule out all birth  defects or genetic syndromes. The patient was advised of this limitation and states she still does not want diagnostic testing at this time.  However, she was counseled that 50-80% of fetuses with Down syndrome and up to 90% of fetuses with trisomies 13 and 18, when well visualized, have detectable anomalies or soft markers by ultrasound.   Haley Owens was provided with written information regarding sickle cell anemia (SCA) including the carrier frequency and incidence in the African-American population, the availability of carrier testing and prenatal diagnosis if indicated.  In addition, we discussed that hemoglobinopathies are routinely screened for as part of the [place] newborn screening panel.  OB medical records indicate that Haley Owens was previously screened and is negative for sickle cell trait. However, medical records were not available at the time of today's visit to confirm if hemoglobin electrophoresis was performed. This is available to the patient, if not previously performed and if desired.   Both family histories were reviewed and found to be noncontributory for birth defects, mental retardation, and known genetic conditions. Without further information regarding the provided family history, an accurate genetic risk cannot be calculated. Further genetic counseling is warranted if more information is obtained.  Haley Owens denied exposure to environmental toxins or chemical agents. She denied the use of alcohol, tobacco or street drugs. She denied significant viral illnesses during the course of her pregnancy. Her medical and surgical histories were noncontributory.   I counseled Haley Owens regarding the above risks and available options.  The approximate face-to-face time with the genetic counselor was 25 minutes.  Haley Plowman, MS,  Certified Genetic Counselor 08/22/2011

## 2011-09-05 ENCOUNTER — Ambulatory Visit: Payer: Medicaid Other | Admitting: Internal Medicine

## 2011-09-05 DIAGNOSIS — Z0289 Encounter for other administrative examinations: Secondary | ICD-10-CM

## 2011-09-15 ENCOUNTER — Other Ambulatory Visit: Payer: Self-pay

## 2011-10-01 ENCOUNTER — Other Ambulatory Visit (HOSPITAL_COMMUNITY): Payer: Self-pay | Admitting: Obstetrics

## 2011-10-01 DIAGNOSIS — O09529 Supervision of elderly multigravida, unspecified trimester: Secondary | ICD-10-CM

## 2011-10-01 DIAGNOSIS — Z3689 Encounter for other specified antenatal screening: Secondary | ICD-10-CM

## 2011-10-15 ENCOUNTER — Ambulatory Visit (HOSPITAL_COMMUNITY)
Admission: RE | Admit: 2011-10-15 | Discharge: 2011-10-15 | Disposition: A | Discharge status: Home / Self Care | Payer: 59 | Source: Ambulatory Visit | Attending: Obstetrics | Admitting: Obstetrics

## 2011-10-15 ENCOUNTER — Encounter (HOSPITAL_COMMUNITY): Payer: Self-pay

## 2011-10-15 DIAGNOSIS — O09529 Supervision of elderly multigravida, unspecified trimester: Secondary | ICD-10-CM | POA: Insufficient documentation

## 2011-10-15 DIAGNOSIS — O358XX0 Maternal care for other (suspected) fetal abnormality and damage, not applicable or unspecified: Secondary | ICD-10-CM | POA: Insufficient documentation

## 2011-10-15 DIAGNOSIS — Z1389 Encounter for screening for other disorder: Secondary | ICD-10-CM | POA: Insufficient documentation

## 2011-10-15 DIAGNOSIS — O2620 Pregnancy care for patient with recurrent pregnancy loss, unspecified trimester: Secondary | ICD-10-CM | POA: Insufficient documentation

## 2011-10-15 DIAGNOSIS — Z3689 Encounter for other specified antenatal screening: Secondary | ICD-10-CM

## 2011-10-15 DIAGNOSIS — Z363 Encounter for antenatal screening for malformations: Secondary | ICD-10-CM | POA: Insufficient documentation

## 2011-10-15 LAB — QUAD SCREEN FOR MFM

## 2011-11-04 ENCOUNTER — Other Ambulatory Visit (HOSPITAL_COMMUNITY): Payer: Self-pay | Admitting: Obstetrics

## 2011-11-04 ENCOUNTER — Ambulatory Visit (HOSPITAL_COMMUNITY)
Admission: RE | Admit: 2011-11-04 | Discharge: 2011-11-04 | Disposition: A | Discharge status: Home / Self Care | Payer: 59 | Source: Ambulatory Visit | Attending: Obstetrics | Admitting: Obstetrics

## 2011-11-04 DIAGNOSIS — Z1389 Encounter for screening for other disorder: Secondary | ICD-10-CM | POA: Insufficient documentation

## 2011-11-04 DIAGNOSIS — O2620 Pregnancy care for patient with recurrent pregnancy loss, unspecified trimester: Secondary | ICD-10-CM | POA: Insufficient documentation

## 2011-11-04 DIAGNOSIS — O358XX0 Maternal care for other (suspected) fetal abnormality and damage, not applicable or unspecified: Secondary | ICD-10-CM | POA: Insufficient documentation

## 2011-11-04 DIAGNOSIS — Z363 Encounter for antenatal screening for malformations: Secondary | ICD-10-CM | POA: Insufficient documentation

## 2011-11-04 DIAGNOSIS — O09529 Supervision of elderly multigravida, unspecified trimester: Secondary | ICD-10-CM

## 2011-11-04 DIAGNOSIS — O368990 Maternal care for other specified fetal problems, unspecified trimester, not applicable or unspecified: Secondary | ICD-10-CM

## 2011-11-18 ENCOUNTER — Other Ambulatory Visit: Payer: Self-pay

## 2011-11-20 ENCOUNTER — Encounter (HOSPITAL_COMMUNITY): Payer: Self-pay | Admitting: Advanced Practice Midwife

## 2011-11-20 ENCOUNTER — Inpatient Hospital Stay (HOSPITAL_COMMUNITY)
Admission: AD | Admit: 2011-11-20 | Discharge: 2011-11-20 | Disposition: A | Discharge status: Home / Self Care | Payer: 59 | Source: Ambulatory Visit | Attending: Obstetrics | Admitting: Obstetrics

## 2011-11-20 DIAGNOSIS — B9689 Other specified bacterial agents as the cause of diseases classified elsewhere: Secondary | ICD-10-CM | POA: Insufficient documentation

## 2011-11-20 DIAGNOSIS — O4692 Antepartum hemorrhage, unspecified, second trimester: Secondary | ICD-10-CM

## 2011-11-20 DIAGNOSIS — O2390 Unspecified genitourinary tract infection in pregnancy, unspecified trimester: Secondary | ICD-10-CM | POA: Insufficient documentation

## 2011-11-20 DIAGNOSIS — N760 Acute vaginitis: Secondary | ICD-10-CM | POA: Insufficient documentation

## 2011-11-20 DIAGNOSIS — O4690 Antepartum hemorrhage, unspecified, unspecified trimester: Secondary | ICD-10-CM | POA: Insufficient documentation

## 2011-11-20 DIAGNOSIS — A499 Bacterial infection, unspecified: Secondary | ICD-10-CM | POA: Insufficient documentation

## 2011-11-20 LAB — URINALYSIS, ROUTINE W REFLEX MICROSCOPIC
Bilirubin Urine: NEGATIVE
Ketones, ur: NEGATIVE mg/dL
Leukocytes, UA: NEGATIVE
Nitrite: NEGATIVE
Urobilinogen, UA: 0.2 mg/dL (ref 0.0–1.0)
pH: 6 (ref 5.0–8.0)

## 2011-11-20 LAB — URINE MICROSCOPIC-ADD ON

## 2011-11-20 LAB — WET PREP, GENITAL: Yeast Wet Prep HPF POC: NONE SEEN

## 2011-11-20 MED ORDER — METRONIDAZOLE 500 MG PO TABS: 500.0000 mg | ORAL_TABLET | Freq: Two times a day (BID) | ORAL | Status: AC

## 2011-11-20 NOTE — MAU Provider Note (Signed)
History     CSN: [id number]  Arrival date and time: 11/20/11 1943   First Provider Initiated Contact with Patient 11/20/11 2020      Chief Complaint  Patient presents with  . Vaginal Bleeding   HPI 36 y.o. [id number] at [redacted]w[redacted]d with episode of vaginal bleeding with wiping around 1900, bright red, none since, no pain. 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 Cervical length 3.2 cm, normal placenta on last u/s (11/04/11 at MFM).    Past Medical History  Diagnosis Date  . Diabetes mellitus     A1C 6.0 12-2009  . Anemia     h/o  . Anxiety     No past surgical history on file.  Family History  Problem Relation Age of Onset  . Hypertension Mother   . Hyperlipidemia Mother   . Arthritis Maternal Grandmother   . Diabetes Maternal Grandfather   . Arthritis Maternal Grandfather   . Arthritis Paternal Grandmother   . Arthritis Paternal Grandfather     History  Substance Use Topics  . Smoking status: Former Games developer  . Smokeless tobacco: Not on file  . Alcohol Use: Yes    Allergies: No Known Allergies  Prescriptions prior to admission  Medication Sig Dispense Refill  . PRENATAL VITAMINS PO Take by mouth.        Review of Systems  Constitutional: Negative.   Respiratory: Negative.   Cardiovascular: Negative.   Gastrointestinal: Negative for nausea, vomiting, abdominal pain, diarrhea and constipation.  Genitourinary: Negative for dysuria, urgency, frequency, hematuria and flank pain.       Negative cramping/contractions, Positive for vaginal bleeding  Musculoskeletal: Negative.   Neurological: Negative.   Psychiatric/Behavioral: Negative.    Physical Exam   Blood pressure 125/58, pulse 91, temperature 98.1 F (36.7 C), temperature source Oral, resp. rate 18, height 5\' 1"  (1.549 m), weight 215 lb (97.523 kg), last menstrual period 05/23/2011.  Physical Exam  Nursing note and vitals reviewed. Constitutional: She is oriented  to person, place, and time. She appears well-developed and well-nourished. No distress.  HENT:  Head: Normocephalic and atraumatic.  Cardiovascular: Normal rate.   Respiratory: Effort normal.  GI: Soft. She exhibits no mass. There is no tenderness. There is no rebound and no guarding.  Genitourinary: There is no rash or lesion on the right labia. There is no rash or lesion on the left labia. Uterus is not tender. Enlarged: Size c/w dates. Cervix exhibits no motion tenderness, no discharge and no friability. Right adnexum displays no mass, no tenderness and no fullness. Left adnexum displays no mass, no tenderness and no fullness. There is bleeding (small blood in vaginal vault) around the vagina. No tenderness around the vagina. No vaginal discharge found.       SVE: 1 cm/thick/high/posterior  Musculoskeletal: Normal range of motion.  Neurological: She is alert and oriented to person, place, and time.  Skin: Skin is warm and dry.  Psychiatric: She has a normal mood and affect.   EFM: reassuring at 24 weeks, TOCO: quiet MAU Course  Procedures  Results for orders placed during the hospital encounter of 11/20/11 (from the past 24 hour(s))  URINALYSIS, ROUTINE W REFLEX MICROSCOPIC     Status: Abnormal   Collection Time   11/20/11  8:32 PM      Component Value Range   Color, Urine YELLOW  YELLOW    APPearance CLEAR  CLEAR    Specific Gravity, Urine <1.005 (*) 1.005 - 1.030    pH  6.0  5.0 - 8.0    Glucose, UA NEGATIVE  NEGATIVE (mg/dL)   Hgb urine dipstick LARGE (*) NEGATIVE    Bilirubin Urine NEGATIVE  NEGATIVE    Ketones, ur NEGATIVE  NEGATIVE (mg/dL)   Protein, ur NEGATIVE  NEGATIVE (mg/dL)   Urobilinogen, UA 0.2  0.0 - 1.0 (mg/dL)   Nitrite NEGATIVE  NEGATIVE    Leukocytes, UA NEGATIVE  NEGATIVE   URINE MICROSCOPIC-ADD ON     Status: Abnormal   Collection Time   11/20/11  8:32 PM      Component Value Range   Squamous Epithelial / LPF FEW (*) RARE    RBC / HPF 3-6  <3 (RBC/hpf)    WET PREP, GENITAL     Status: Abnormal   Collection Time   11/20/11  8:45 PM      Component Value Range   Yeast Wet Prep HPF POC NONE SEEN  NONE SEEN    Trich, Wet Prep NONE SEEN  NONE SEEN    Clue Cells Wet Prep HPF POC FEW (*) NONE SEEN    WBC, Wet Prep HPF POC RARE (*) NONE SEEN      Assessment and Plan  36 y.o. [id number] at [redacted]w[redacted]d with vaginal bleeding BV - rx Flagyl Bed rest and pelvic rest until next visit on 4/9 - work note given Return with increased bleeding or development of pain  Marilyn Nihiser 11/20/2011, 8:21 PM

## 2011-11-20 NOTE — MAU Note (Signed)
Went to restroom while at work to urinate, had bright red bleeding with wiping.  When used restroom at arrival to hospital no bleeding noted. No recent intercourse, vaginal exams.

## 2011-11-20 NOTE — Discharge Instructions (Signed)
You should remain on bedrest until your next appointment. Rest as much as possible, you may be up to eat, shower and use the restroom. No housework, extending walking or lifting. No sexual activity of any kind. Nothing in the vagina. Return if your bleeding becomes heavier or if you develop pain.

## 2011-12-02 ENCOUNTER — Ambulatory Visit (HOSPITAL_COMMUNITY)
Admission: RE | Admit: 2011-12-02 | Discharge: 2011-12-02 | Disposition: A | Discharge status: Home / Self Care | Payer: 59 | Source: Ambulatory Visit | Attending: Obstetrics | Admitting: Obstetrics

## 2011-12-02 DIAGNOSIS — O2620 Pregnancy care for patient with recurrent pregnancy loss, unspecified trimester: Secondary | ICD-10-CM | POA: Insufficient documentation

## 2011-12-02 DIAGNOSIS — O368990 Maternal care for other specified fetal problems, unspecified trimester, not applicable or unspecified: Secondary | ICD-10-CM

## 2011-12-02 DIAGNOSIS — O09529 Supervision of elderly multigravida, unspecified trimester: Secondary | ICD-10-CM | POA: Insufficient documentation

## 2011-12-02 DIAGNOSIS — O358XX0 Maternal care for other (suspected) fetal abnormality and damage, not applicable or unspecified: Secondary | ICD-10-CM | POA: Insufficient documentation

## 2012-01-08 ENCOUNTER — Ambulatory Visit (HOSPITAL_COMMUNITY): Payer: 59

## 2012-01-29 ENCOUNTER — Ambulatory Visit (HOSPITAL_COMMUNITY)
Admission: RE | Admit: 2012-01-29 | Discharge: 2012-01-29 | Disposition: A | Discharge status: Home / Self Care | Payer: 59 | Source: Ambulatory Visit | Attending: Obstetrics | Admitting: Obstetrics

## 2012-01-29 ENCOUNTER — Encounter: Payer: 59 | Attending: Obstetrics | Admitting: Dietician

## 2012-01-29 ENCOUNTER — Encounter (HOSPITAL_COMMUNITY): Payer: Self-pay

## 2012-01-29 ENCOUNTER — Other Ambulatory Visit: Payer: Self-pay | Admitting: Obstetrics

## 2012-01-29 ENCOUNTER — Ambulatory Visit (HOSPITAL_COMMUNITY): Admission: RE | Admit: 2012-01-29 | Payer: 59 | Source: Ambulatory Visit

## 2012-01-29 DIAGNOSIS — O99810 Abnormal glucose complicating pregnancy: Secondary | ICD-10-CM | POA: Insufficient documentation

## 2012-01-29 DIAGNOSIS — Z713 Dietary counseling and surveillance: Secondary | ICD-10-CM | POA: Insufficient documentation

## 2012-01-29 NOTE — ED Notes (Signed)
Diabetes Education:  G8 P3 with a history of GDM with her last pregnancy 10 years ago.  Following delivery, she became developed Pre-Diabetes and was controlling blood glucose with diet. Her most recent A1C was 6.0 on 01/12/12.  EDC is 03/05/2012.  Pre pregnancy weight was at 190 lb.  Current weight is at 216.75 lb.  Ht:61 in.  Only medication is a prenatal vitamin.  Plan:  Review of the diet for the diabetes of pregnancy, review of carb counting and the suggested meal plan.  Provided an Accu Chek SmartView meter kit [id number] Expiration 02/21/2013.  On return demonstration of glucose testing procedure, her glucose was 105 mg.  She has not eaten today.  Instructed to monitor fasting, and 2 hr after the first bite of each meal.  She is to call in her blood glucose readings to Humphrey Rolls, RN on Monday June, 10th. Introduced to Dr.Nitsche today.  Will follow glucose levels as needed.  Haley Yariel Ferraris, RN, RD, CDE

## 2012-01-29 NOTE — ED Notes (Signed)
Pt states +FM today.

## 2012-02-04 ENCOUNTER — Other Ambulatory Visit (HOSPITAL_COMMUNITY): Payer: Self-pay | Admitting: Obstetrics

## 2012-02-04 ENCOUNTER — Ambulatory Visit (HOSPITAL_COMMUNITY)
Admission: RE | Admit: 2012-02-04 | Discharge: 2012-02-04 | Disposition: A | Discharge status: Home / Self Care | Payer: 59 | Source: Ambulatory Visit | Attending: Obstetrics | Admitting: Obstetrics

## 2012-02-04 DIAGNOSIS — O2620 Pregnancy care for patient with recurrent pregnancy loss, unspecified trimester: Secondary | ICD-10-CM | POA: Insufficient documentation

## 2012-02-04 DIAGNOSIS — O24919 Unspecified diabetes mellitus in pregnancy, unspecified trimester: Secondary | ICD-10-CM

## 2012-02-04 DIAGNOSIS — O09529 Supervision of elderly multigravida, unspecified trimester: Secondary | ICD-10-CM

## 2012-02-04 DIAGNOSIS — O99810 Abnormal glucose complicating pregnancy: Secondary | ICD-10-CM | POA: Insufficient documentation

## 2012-02-04 DIAGNOSIS — O358XX0 Maternal care for other (suspected) fetal abnormality and damage, not applicable or unspecified: Secondary | ICD-10-CM | POA: Insufficient documentation

## 2012-02-04 NOTE — Progress Notes (Signed)
Patient seen today  for follow up ultrasound.  See full report in [facility].  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  Recommend delivery at 39 weeks due to this diagnosis.

## 2012-02-04 NOTE — ED Notes (Signed)
Pt brought in BS's for md to review.

## 2012-02-10 ENCOUNTER — Other Ambulatory Visit (HOSPITAL_COMMUNITY): Payer: Self-pay | Admitting: Obstetrics

## 2012-02-10 ENCOUNTER — Ambulatory Visit (HOSPITAL_COMMUNITY)
Admission: RE | Admit: 2012-02-10 | Discharge: 2012-02-10 | Disposition: A | Discharge status: Home / Self Care | Payer: 59 | Source: Ambulatory Visit | Attending: Obstetrics | Admitting: Obstetrics

## 2012-02-10 VITALS — BP 117/58 | HR 89 | Wt 218.5 lb

## 2012-02-10 DIAGNOSIS — O09529 Supervision of elderly multigravida, unspecified trimester: Secondary | ICD-10-CM | POA: Insufficient documentation

## 2012-02-10 DIAGNOSIS — O358XX0 Maternal care for other (suspected) fetal abnormality and damage, not applicable or unspecified: Secondary | ICD-10-CM | POA: Insufficient documentation

## 2012-02-10 DIAGNOSIS — O99810 Abnormal glucose complicating pregnancy: Secondary | ICD-10-CM | POA: Insufficient documentation

## 2012-02-10 DIAGNOSIS — O24419 Gestational diabetes mellitus in pregnancy, unspecified control: Secondary | ICD-10-CM

## 2012-02-10 NOTE — ED Notes (Signed)
Pt here for nst.  Did not bring sugars with her.  States her fasting was less than 90 but unsure what it was.  Instructed pt to bring blood sugar results with her on Friday for MD to review.  Pt verbalized understanding.

## 2012-02-13 ENCOUNTER — Ambulatory Visit (HOSPITAL_COMMUNITY): Admission: RE | Admit: 2012-02-13 | Payer: 59 | Source: Ambulatory Visit

## 2012-02-13 ENCOUNTER — Ambulatory Visit (HOSPITAL_COMMUNITY): Payer: 59 | Attending: Obstetrics

## 2012-02-16 LAB — OB RESULTS CONSOLE GBS: GBS: NEGATIVE

## 2012-02-19 ENCOUNTER — Telehealth (HOSPITAL_COMMUNITY): Payer: Self-pay | Admitting: *Deleted

## 2012-02-19 ENCOUNTER — Encounter (HOSPITAL_COMMUNITY): Payer: Self-pay | Admitting: *Deleted

## 2012-02-19 NOTE — Telephone Encounter (Signed)
Preadmission screen  

## 2012-02-23 ENCOUNTER — Encounter (HOSPITAL_COMMUNITY): Payer: Self-pay | Admitting: *Deleted

## 2012-02-23 ENCOUNTER — Telehealth (HOSPITAL_COMMUNITY): Payer: Self-pay | Admitting: *Deleted

## 2012-02-23 NOTE — Telephone Encounter (Signed)
Preadmission screen  

## 2012-02-27 ENCOUNTER — Inpatient Hospital Stay (HOSPITAL_COMMUNITY)
Admission: RE | Admit: 2012-02-27 | Discharge: 2012-03-01 | DRG: 766 | Disposition: A | Discharge status: Home / Self Care | Payer: 59 | Source: Ambulatory Visit | Attending: Obstetrics | Admitting: Obstetrics

## 2012-02-27 ENCOUNTER — Encounter (HOSPITAL_COMMUNITY): Payer: Self-pay

## 2012-02-27 VITALS — BP 112/66 | HR 94 | Temp 98.1°F | Resp 18 | Ht 62.0 in | Wt 218.0 lb

## 2012-02-27 DIAGNOSIS — Z98891 History of uterine scar from previous surgery: Secondary | ICD-10-CM

## 2012-02-27 DIAGNOSIS — R011 Cardiac murmur, unspecified: Secondary | ICD-10-CM

## 2012-02-27 DIAGNOSIS — O358XX0 Maternal care for other (suspected) fetal abnormality and damage, not applicable or unspecified: Secondary | ICD-10-CM | POA: Diagnosis present

## 2012-02-27 DIAGNOSIS — R1319 Other dysphagia: Secondary | ICD-10-CM

## 2012-02-27 DIAGNOSIS — O99814 Abnormal glucose complicating childbirth: Secondary | ICD-10-CM | POA: Diagnosis present

## 2012-02-27 DIAGNOSIS — R1012 Left upper quadrant pain: Secondary | ICD-10-CM

## 2012-02-27 DIAGNOSIS — E669 Obesity, unspecified: Secondary | ICD-10-CM

## 2012-02-27 DIAGNOSIS — E119 Type 2 diabetes mellitus without complications: Secondary | ICD-10-CM

## 2012-02-27 LAB — CBC
MCH: 24 pg — ABNORMAL LOW (ref 26.0–34.0)
MCV: 76.2 fL — ABNORMAL LOW (ref 78.0–100.0)
Platelets: 163 10*3/uL (ref 150–400)
RDW: 14.8 % (ref 11.5–15.5)
WBC: 7.7 10*3/uL (ref 4.0–10.5)

## 2012-02-27 LAB — ABO/RH: ABO/RH(D): O POS

## 2012-02-27 MED ORDER — LACTATED RINGERS IV SOLN
500.0000 mL | INTRAVENOUS | Status: DC | PRN
  Administered 2012-02-28 (×2): 500 mL via INTRAVENOUS

## 2012-02-27 MED ORDER — BUTORPHANOL TARTRATE 2 MG/ML IJ SOLN: 1.0000 mg | INTRAMUSCULAR | Status: DC | PRN

## 2012-02-27 MED ORDER — LACTATED RINGERS IV SOLN
INTRAVENOUS | Status: DC
  Administered 2012-02-27 – 2012-02-28 (×4): via INTRAVENOUS
  Administered 2012-02-28: 500 mL via INTRAVENOUS
  Administered 2012-02-28 (×2): via INTRAVENOUS

## 2012-02-27 MED ORDER — DIPHENHYDRAMINE HCL 50 MG/ML IJ SOLN: 12.5000 mg | INTRAMUSCULAR | Status: DC | PRN

## 2012-02-27 MED ORDER — FENTANYL 2.5 MCG/ML BUPIVACAINE 1/10 % EPIDURAL INFUSION (WH - ANES)
14.0000 mL/h | INTRAMUSCULAR | Status: DC
  Administered 2012-02-28 (×3): 14 mL/h via EPIDURAL
  Filled 2012-02-27 (×4): qty 60

## 2012-02-27 MED ORDER — CITRIC ACID-SODIUM CITRATE 334-500 MG/5ML PO SOLN
30.0000 mL | ORAL | Status: DC | PRN
  Administered 2012-02-28: 30 mL via ORAL
  Filled 2012-02-27: qty 15

## 2012-02-27 MED ORDER — LIDOCAINE HCL (PF) 1 % IJ SOLN: 30.0000 mL | INTRAMUSCULAR | Status: DC | PRN

## 2012-02-27 MED ORDER — PHENYLEPHRINE 40 MCG/ML (10ML) SYRINGE FOR IV PUSH (FOR BLOOD PRESSURE SUPPORT): 80.0000 ug | PREFILLED_SYRINGE | INTRAVENOUS | Status: DC | PRN

## 2012-02-27 MED ORDER — FENTANYL 2.5 MCG/ML BUPIVACAINE 1/10 % EPIDURAL INFUSION (WH - ANES): 14.0000 mL/h | INTRAMUSCULAR | Status: DC

## 2012-02-27 MED ORDER — FLEET ENEMA 7-19 GM/118ML RE ENEM: 1.0000 | ENEMA | RECTAL | Status: DC | PRN

## 2012-02-27 MED ORDER — EPHEDRINE 5 MG/ML INJ
10.0000 mg | INTRAVENOUS | Status: AC | PRN
  Administered 2012-02-28 (×2): 10 mg via INTRAVENOUS
  Filled 2012-02-27: qty 4

## 2012-02-27 MED ORDER — EPHEDRINE 5 MG/ML INJ: 10.0000 mg | INTRAVENOUS | Status: DC | PRN

## 2012-02-27 MED ORDER — IBUPROFEN 600 MG PO TABS: 600.0000 mg | ORAL_TABLET | Freq: Four times a day (QID) | ORAL | Status: DC | PRN

## 2012-02-27 MED ORDER — OXYTOCIN 40 UNITS IN LACTATED RINGERS INFUSION - SIMPLE MED
1.0000 m[IU]/min | INTRAVENOUS | Status: DC
  Administered 2012-02-27: 2 m[IU]/min via INTRAVENOUS
  Administered 2012-02-28: 12 m[IU]/min via INTRAVENOUS
  Filled 2012-02-27: qty 1000

## 2012-02-27 MED ORDER — ACETAMINOPHEN 325 MG PO TABS: 650.0000 mg | ORAL_TABLET | ORAL | Status: DC | PRN

## 2012-02-27 MED ORDER — ONDANSETRON HCL 4 MG/2ML IJ SOLN: 4.0000 mg | Freq: Four times a day (QID) | INTRAMUSCULAR | Status: DC | PRN

## 2012-02-27 MED ORDER — TERBUTALINE SULFATE 1 MG/ML IJ SOLN: 0.2500 mg | Freq: Once | INTRAMUSCULAR | Status: AC | PRN

## 2012-02-27 MED ORDER — LACTATED RINGERS IV SOLN: 500.0000 mL | Freq: Once | INTRAVENOUS | Status: DC

## 2012-02-27 MED ORDER — OXYCODONE-ACETAMINOPHEN 5-325 MG PO TABS: 1.0000 | ORAL_TABLET | ORAL | Status: DC | PRN

## 2012-02-27 MED ORDER — LACTATED RINGERS IV SOLN
500.0000 mL | Freq: Once | INTRAVENOUS | Status: AC
  Administered 2012-02-28: 500 mL via INTRAVENOUS

## 2012-02-27 MED ORDER — OXYTOCIN BOLUS FROM INFUSION
250.0000 mL | Freq: Once | INTRAVENOUS | Status: DC
  Filled 2012-02-27: qty 500

## 2012-02-27 MED ORDER — OXYTOCIN 40 UNITS IN LACTATED RINGERS INFUSION - SIMPLE MED: 62.5000 mL/h | Freq: Once | INTRAVENOUS | Status: DC

## 2012-02-27 MED ORDER — MISOPROSTOL 25 MCG QUARTER TABLET
25.0000 ug | ORAL_TABLET | ORAL | Status: AC
  Administered 2012-02-27 (×2): 25 ug via VAGINAL
  Filled 2012-02-27 (×2): qty 0.25

## 2012-02-27 MED ORDER — PHENYLEPHRINE 40 MCG/ML (10ML) SYRINGE FOR IV PUSH (FOR BLOOD PRESSURE SUPPORT)
80.0000 ug | PREFILLED_SYRINGE | INTRAVENOUS | Status: DC | PRN
  Filled 2012-02-27: qty 5

## 2012-02-27 NOTE — Progress Notes (Signed)
Patient ID: Haley Owens, female   DOB: 1976-02-05, 36 y.o.   MRN: [id number] Glucose on admission was 109

## 2012-02-27 NOTE — H&P (Signed)
This is Dr. Francoise Ceo dictating the history and physical on  Haley Owens she's a 36 year old gravida 7 para 303 3 at 65 weeks and a day her [facility] 03/05/2012 the patient is a gestational diabetic who has been followed by  MFM and is on glyburide 5 mg by mouth at at bedtime and 2.5 mg by mouth in the  am her sugars have supposedly been normal   the patient refuses to bring the results to the   ultrasounds on the fetus also shows a dysplastic large left kidney from earlier during the pregnancy patient has been followed by MFM with nonstress tests and ultrasounds and is in for induction at 39 weeks her cervix is 2 cm 50% vertex -3 and she was not contracting and it was decided she gets Cytotec x2 then Pitocin if necessary Past medical history negative Past surgical history negative Social history negative System review noncontributory Physical exam well-developed female in no distress HEENT negative Breasts negative Lungs clear to P&A Heart regular rhythm no murmurs no gallops Abdomen term Pelvic as described above Extremities negative

## 2012-02-28 ENCOUNTER — Inpatient Hospital Stay (HOSPITAL_COMMUNITY): Payer: 59 | Admitting: Anesthesiology

## 2012-02-28 ENCOUNTER — Encounter (HOSPITAL_COMMUNITY)
Admission: RE | Disposition: A | Discharge status: Home / Self Care | Payer: Self-pay | Source: Ambulatory Visit | Attending: Obstetrics

## 2012-02-28 ENCOUNTER — Encounter (HOSPITAL_COMMUNITY): Payer: Self-pay | Admitting: Anesthesiology

## 2012-02-28 ENCOUNTER — Encounter (HOSPITAL_COMMUNITY): Payer: Self-pay

## 2012-02-28 LAB — CBC
HCT: 34 % — ABNORMAL LOW (ref 36.0–46.0)
Hemoglobin: 10.6 g/dL — ABNORMAL LOW (ref 12.0–15.0)
RBC: 4.46 MIL/uL (ref 3.87–5.11)
WBC: 11.5 10*3/uL — ABNORMAL HIGH (ref 4.0–10.5)

## 2012-02-28 LAB — GLUCOSE, CAPILLARY
Glucose-Capillary: 94 mg/dL (ref 70–99)
Glucose-Capillary: 96 mg/dL (ref 70–99)
Glucose-Capillary: 119 mg/dL — ABNORMAL HIGH (ref 70–99)

## 2012-02-28 SURGERY — Surgical Case
Anesthesia: Regional | Site: Abdomen | Wound class: Clean Contaminated

## 2012-02-28 MED ORDER — KETOROLAC TROMETHAMINE 30 MG/ML IJ SOLN
30.0000 mg | Freq: Four times a day (QID) | INTRAMUSCULAR | Status: AC | PRN
  Administered 2012-02-28 – 2012-02-29 (×2): 30 mg via INTRAVENOUS
  Filled 2012-02-28: qty 1

## 2012-02-28 MED ORDER — LANOLIN HYDROUS EX OINT: 1.0000 "application " | TOPICAL_OINTMENT | CUTANEOUS | Status: DC | PRN

## 2012-02-28 MED ORDER — SCOPOLAMINE 1 MG/3DAYS TD PT72
MEDICATED_PATCH | TRANSDERMAL | Status: AC
  Administered 2012-02-28: 1.5 mg via TRANSDERMAL
  Filled 2012-02-28: qty 1

## 2012-02-28 MED ORDER — ONDANSETRON HCL 4 MG/2ML IJ SOLN
INTRAMUSCULAR | Status: AC
  Filled 2012-02-28: qty 2

## 2012-02-28 MED ORDER — TETANUS-DIPHTH-ACELL PERTUSSIS 5-2.5-18.5 LF-MCG/0.5 IM SUSP
0.5000 mL | Freq: Once | INTRAMUSCULAR | Status: AC
  Administered 2012-02-29: 0.5 mL via INTRAMUSCULAR

## 2012-02-28 MED ORDER — DIPHENHYDRAMINE HCL 50 MG/ML IJ SOLN: 25.0000 mg | INTRAMUSCULAR | Status: DC | PRN

## 2012-02-28 MED ORDER — IBUPROFEN 600 MG PO TABS
600.0000 mg | ORAL_TABLET | Freq: Four times a day (QID) | ORAL | Status: DC
  Administered 2012-02-29 – 2012-03-01 (×5): 600 mg via ORAL
  Filled 2012-02-28: qty 1

## 2012-02-28 MED ORDER — SENNOSIDES-DOCUSATE SODIUM 8.6-50 MG PO TABS
2.0000 | ORAL_TABLET | Freq: Every day | ORAL | Status: DC
  Administered 2012-02-29: 2 via ORAL

## 2012-02-28 MED ORDER — MORPHINE SULFATE (PF) 0.5 MG/ML IJ SOLN
INTRAMUSCULAR | Status: DC | PRN
  Administered 2012-02-28: 1 mg via INTRAVENOUS

## 2012-02-28 MED ORDER — DIPHENHYDRAMINE HCL 25 MG PO CAPS: 25.0000 mg | ORAL_CAPSULE | ORAL | Status: DC | PRN

## 2012-02-28 MED ORDER — PHENYLEPHRINE 40 MCG/ML (10ML) SYRINGE FOR IV PUSH (FOR BLOOD PRESSURE SUPPORT)
PREFILLED_SYRINGE | INTRAVENOUS | Status: AC
  Filled 2012-02-28: qty 10

## 2012-02-28 MED ORDER — ONDANSETRON HCL 4 MG/2ML IJ SOLN
4.0000 mg | INTRAMUSCULAR | Status: DC | PRN
  Administered 2012-02-29: 4 mg via INTRAVENOUS
  Filled 2012-02-28: qty 2

## 2012-02-28 MED ORDER — MENTHOL 3 MG MT LOZG: 1.0000 | LOZENGE | OROMUCOSAL | Status: DC | PRN

## 2012-02-28 MED ORDER — NALOXONE HCL 0.4 MG/ML IJ SOLN: 0.4000 mg | INTRAMUSCULAR | Status: DC | PRN

## 2012-02-28 MED ORDER — CEFAZOLIN SODIUM-DEXTROSE 2-3 GM-% IV SOLR
2.0000 g | Freq: Once | INTRAVENOUS | Status: AC
  Administered 2012-02-28: 2 g via INTRAVENOUS
  Filled 2012-02-28: qty 50

## 2012-02-28 MED ORDER — PHENYLEPHRINE HCL 10 MG/ML IJ SOLN
INTRAMUSCULAR | Status: DC | PRN
  Administered 2012-02-28: 80 ug via INTRAVENOUS

## 2012-02-28 MED ORDER — MEPERIDINE HCL 25 MG/ML IJ SOLN: 6.2500 mg | INTRAMUSCULAR | Status: DC | PRN

## 2012-02-28 MED ORDER — OXYCODONE-ACETAMINOPHEN 5-325 MG PO TABS
1.0000 | ORAL_TABLET | ORAL | Status: DC | PRN
  Administered 2012-02-29: 1 via ORAL
  Administered 2012-03-01: 2 via ORAL
  Filled 2012-02-28: qty 2
  Filled 2012-02-28: qty 1

## 2012-02-28 MED ORDER — DIPHENHYDRAMINE HCL 50 MG/ML IJ SOLN: 12.5000 mg | INTRAMUSCULAR | Status: DC | PRN

## 2012-02-28 MED ORDER — LIDOCAINE-EPINEPHRINE (PF) 2 %-1:200000 IJ SOLN
INTRAMUSCULAR | Status: AC
  Filled 2012-02-28: qty 20

## 2012-02-28 MED ORDER — ZOLPIDEM TARTRATE 5 MG PO TABS: 5.0000 mg | ORAL_TABLET | Freq: Every evening | ORAL | Status: DC | PRN

## 2012-02-28 MED ORDER — ONDANSETRON HCL 4 MG/2ML IJ SOLN: 4.0000 mg | Freq: Three times a day (TID) | INTRAMUSCULAR | Status: DC | PRN

## 2012-02-28 MED ORDER — MORPHINE SULFATE 0.5 MG/ML IJ SOLN
INTRAMUSCULAR | Status: AC
  Filled 2012-02-28: qty 10

## 2012-02-28 MED ORDER — IBUPROFEN 600 MG PO TABS
600.0000 mg | ORAL_TABLET | Freq: Four times a day (QID) | ORAL | Status: DC | PRN
  Filled 2012-02-28 (×4): qty 1

## 2012-02-28 MED ORDER — LACTATED RINGERS IV SOLN
INTRAVENOUS | Status: DC | PRN
  Administered 2012-02-28 (×3): via INTRAVENOUS

## 2012-02-28 MED ORDER — DIPHENHYDRAMINE HCL 25 MG PO CAPS: 25.0000 mg | ORAL_CAPSULE | Freq: Four times a day (QID) | ORAL | Status: DC | PRN

## 2012-02-28 MED ORDER — FENTANYL CITRATE 0.05 MG/ML IJ SOLN
INTRAMUSCULAR | Status: AC
  Filled 2012-02-28: qty 2

## 2012-02-28 MED ORDER — SODIUM CHLORIDE 0.9 % IV SOLN
1.0000 ug/kg/h | INTRAVENOUS | Status: DC | PRN
  Filled 2012-02-28: qty 2.5

## 2012-02-28 MED ORDER — ONDANSETRON HCL 4 MG/2ML IJ SOLN
INTRAMUSCULAR | Status: DC | PRN
  Administered 2012-02-28: 4 mg via INTRAVENOUS

## 2012-02-28 MED ORDER — SODIUM BICARBONATE 8.4 % IV SOLN
INTRAVENOUS | Status: DC | PRN
  Administered 2012-02-28: 5 mL via EPIDURAL

## 2012-02-28 MED ORDER — SIMETHICONE 80 MG PO CHEW: 80.0000 mg | CHEWABLE_TABLET | ORAL | Status: DC | PRN

## 2012-02-28 MED ORDER — LACTATED RINGERS IV SOLN: INTRAVENOUS | Status: DC

## 2012-02-28 MED ORDER — LIDOCAINE HCL (PF) 1 % IJ SOLN
INTRAMUSCULAR | Status: DC | PRN
  Administered 2012-02-28 (×2): 9 mL

## 2012-02-28 MED ORDER — KETOROLAC TROMETHAMINE 60 MG/2ML IM SOLN
60.0000 mg | Freq: Once | INTRAMUSCULAR | Status: AC | PRN
Start: 2012-02-28 — End: 2012-02-28
  Filled 2012-02-28: qty 2

## 2012-02-28 MED ORDER — MORPHINE SULFATE (PF) 0.5 MG/ML IJ SOLN
INTRAMUSCULAR | Status: DC | PRN
  Administered 2012-02-28: 4 mg via EPIDURAL

## 2012-02-28 MED ORDER — NALBUPHINE HCL 10 MG/ML IJ SOLN
5.0000 mg | INTRAMUSCULAR | Status: DC | PRN
Start: 2012-02-28 — End: 2012-03-01
  Filled 2012-02-28: qty 1

## 2012-02-28 MED ORDER — HYDROMORPHONE HCL PF 1 MG/ML IJ SOLN: 0.2500 mg | INTRAMUSCULAR | Status: DC | PRN

## 2012-02-28 MED ORDER — LACTATED RINGERS IV SOLN
INTRAVENOUS | Status: DC
  Administered 2012-02-28: 325 mL via INTRAUTERINE

## 2012-02-28 MED ORDER — SIMETHICONE 80 MG PO CHEW
80.0000 mg | CHEWABLE_TABLET | Freq: Three times a day (TID) | ORAL | Status: DC
  Administered 2012-02-29 – 2012-03-01 (×4): 80 mg via ORAL

## 2012-02-28 MED ORDER — DIBUCAINE 1 % RE OINT: 1.0000 "application " | TOPICAL_OINTMENT | RECTAL | Status: DC | PRN

## 2012-02-28 MED ORDER — WITCH HAZEL-GLYCERIN EX PADS: 1.0000 "application " | MEDICATED_PAD | CUTANEOUS | Status: DC | PRN

## 2012-02-28 MED ORDER — METOCLOPRAMIDE HCL 5 MG/ML IJ SOLN
10.0000 mg | Freq: Three times a day (TID) | INTRAMUSCULAR | Status: DC | PRN
  Administered 2012-02-28: 10 mg via INTRAVENOUS

## 2012-02-28 MED ORDER — SODIUM CHLORIDE 0.9 % IR SOLN
Status: DC | PRN
  Administered 2012-02-28: 1000 mL

## 2012-02-28 MED ORDER — ONDANSETRON HCL 4 MG PO TABS: 4.0000 mg | ORAL_TABLET | ORAL | Status: DC | PRN

## 2012-02-28 MED ORDER — PRENATAL MULTIVITAMIN CH
1.0000 | ORAL_TABLET | Freq: Every day | ORAL | Status: DC
  Administered 2012-02-29 – 2012-03-01 (×2): 1 via ORAL
  Filled 2012-02-28 (×2): qty 1

## 2012-02-28 MED ORDER — FENTANYL 2.5 MCG/ML BUPIVACAINE 1/10 % EPIDURAL INFUSION (WH - ANES)
INTRAMUSCULAR | Status: DC | PRN
  Administered 2012-02-28: 14 mL/h via EPIDURAL

## 2012-02-28 MED ORDER — KETOROLAC TROMETHAMINE 30 MG/ML IJ SOLN
INTRAMUSCULAR | Status: AC
  Administered 2012-02-28: 30 mg via INTRAVENOUS
  Filled 2012-02-28: qty 1

## 2012-02-28 MED ORDER — OXYTOCIN 10 UNIT/ML IJ SOLN
40.0000 [IU] | INTRAVENOUS | Status: DC | PRN
  Administered 2012-02-28: 40 [IU] via INTRAVENOUS

## 2012-02-28 MED ORDER — CEFAZOLIN SODIUM-DEXTROSE 2-3 GM-% IV SOLR
INTRAVENOUS | Status: AC
  Filled 2012-02-28: qty 50

## 2012-02-28 MED ORDER — SCOPOLAMINE 1 MG/3DAYS TD PT72
1.0000 | MEDICATED_PATCH | Freq: Once | TRANSDERMAL | Status: DC
  Administered 2012-02-28: 1.5 mg via TRANSDERMAL

## 2012-02-28 MED ORDER — MEPERIDINE HCL 25 MG/ML IJ SOLN
INTRAMUSCULAR | Status: AC
  Filled 2012-02-28: qty 1

## 2012-02-28 MED ORDER — NALBUPHINE HCL 10 MG/ML IJ SOLN
5.0000 mg | INTRAMUSCULAR | Status: DC | PRN
  Filled 2012-02-28: qty 1

## 2012-02-28 MED ORDER — MEPERIDINE HCL 25 MG/ML IJ SOLN
INTRAMUSCULAR | Status: DC | PRN
  Administered 2012-02-28: 25 mg via INTRAVENOUS

## 2012-02-28 MED ORDER — OXYTOCIN 40 UNITS IN LACTATED RINGERS INFUSION - SIMPLE MED: 62.5000 mL/h | INTRAVENOUS | Status: AC

## 2012-02-28 MED ORDER — KETOROLAC TROMETHAMINE 30 MG/ML IJ SOLN: 30.0000 mg | Freq: Four times a day (QID) | INTRAMUSCULAR | Status: AC | PRN

## 2012-02-28 MED ORDER — SODIUM CHLORIDE 0.9 % IJ SOLN: 3.0000 mL | INTRAMUSCULAR | Status: DC | PRN

## 2012-02-28 MED ORDER — METOCLOPRAMIDE HCL 5 MG/ML IJ SOLN
INTRAMUSCULAR | Status: AC
  Administered 2012-02-28: 10 mg via INTRAVENOUS
  Filled 2012-02-28: qty 2

## 2012-02-28 MED ORDER — FENTANYL CITRATE 0.05 MG/ML IJ SOLN
INTRAMUSCULAR | Status: DC | PRN
  Administered 2012-02-28: 100 ug via INTRAVENOUS

## 2012-02-28 SURGICAL SUPPLY — 31 items
ADH SKN CLS APL DERMABOND .7 (GAUZE/BANDAGES/DRESSINGS) ×1
ADH SKN CLS LQ APL DERMABOND (GAUZE/BANDAGES/DRESSINGS) ×1
CLOTH BEACON ORANGE TIMEOUT ST (SAFETY) ×2 IMPLANT
DERMABOND ADHESIVE PROPEN (GAUZE/BANDAGES/DRESSINGS) ×1
DERMABOND ADVANCED (GAUZE/BANDAGES/DRESSINGS) ×1
DERMABOND ADVANCED .7 DNX12 (GAUZE/BANDAGES/DRESSINGS) ×1 IMPLANT
DERMABOND ADVANCED .7 DNX6 (GAUZE/BANDAGES/DRESSINGS) IMPLANT
ELECT REM PT RETURN 9FT ADLT (ELECTROSURGICAL) ×2
ELECTRODE REM PT RTRN 9FT ADLT (ELECTROSURGICAL) ×1 IMPLANT
EXTRACTOR VACUUM M CUP 4 TUBE (SUCTIONS) IMPLANT
GLOVE BIO SURGEON STRL SZ8.5 (GLOVE) ×4 IMPLANT
GOWN PREVENTION PLUS LG XLONG (DISPOSABLE) ×4 IMPLANT
GOWN PREVENTION PLUS XXLARGE (GOWN DISPOSABLE) ×2 IMPLANT
KIT ABG SYR 3ML LUER SLIP (SYRINGE) IMPLANT
NDL HYPO 25X5/8 SAFETYGLIDE (NEEDLE) ×1 IMPLANT
NEEDLE HYPO 25X5/8 SAFETYGLIDE (NEEDLE) IMPLANT
NS IRRIG 1000ML POUR BTL (IV SOLUTION) ×2 IMPLANT
PACK C SECTION WH (CUSTOM PROCEDURE TRAY) ×2 IMPLANT
SLEEVE SCD COMPRESS KNEE MED (MISCELLANEOUS) ×1 IMPLANT
SUT CHROMIC 0 CT 802H (SUTURE) ×2 IMPLANT
SUT CHROMIC 1 CTX 36 (SUTURE) ×4 IMPLANT
SUT CHROMIC 2 0 SH (SUTURE) ×2 IMPLANT
SUT GUT PLAIN 0 CT-3 TAN 27 (SUTURE) IMPLANT
SUT MON AB 4-0 PS1 27 (SUTURE) ×2 IMPLANT
SUT VIC AB 0 CT1 18XCR BRD8 (SUTURE) IMPLANT
SUT VIC AB 0 CT1 8-18 (SUTURE)
SUT VIC AB 0 CTX 36 (SUTURE) ×4
SUT VIC AB 0 CTX36XBRD ANBCTRL (SUTURE) ×2 IMPLANT
TOWEL OR 17X24 6PK STRL BLUE (TOWEL DISPOSABLE) ×4 IMPLANT
TRAY FOLEY CATH 14FR (SET/KITS/TRAYS/PACK) ×1 IMPLANT
WATER STERILE IRR 1000ML POUR (IV SOLUTION) ×2 IMPLANT

## 2012-02-28 NOTE — Progress Notes (Signed)
Patient ID: Haley Owens, female   DOB: [birth year], 36 y.o.   MRN: [id number] Patient is on 24 milliunits of Pitocin and she's not feeling her contractions on examination her cervix is now 3 cm 85% effaced and the vertex -3 station amniotomy performed the fluids clear IUPC inserted patient making slow progress

## 2012-02-28 NOTE — Progress Notes (Signed)
Dr Gaynell Face reviewed fhr tracing and uc tracing. Aware pitocin at 16 mu and aware of vbl and occasional ? Late decels and interventions taken. To continue current pitocin plan and does not desire repeat CBG at this time,

## 2012-02-28 NOTE — OR Nursing (Signed)
Uterus massaged by D. Wegner RN. Two tubes of cord blood sent to lab. 

## 2012-02-28 NOTE — Progress Notes (Signed)
Called Dr. Gaynell Face, report given re: FHR deceleration after epidural, frequency of contractions, and pitocin turned off due to frequency of contractions. Orders received to restart pitocin when needed to maintain contractions at 3 minutes apart.

## 2012-02-28 NOTE — Anesthesia Procedure Notes (Signed)
Epidural Patient location during procedure: OB Start time: 02/28/2012 3:32 AM End time: 02/28/2012 3:36 AM Reason for block: procedure for pain  Staffing Anesthesiologist: Sandrea Hughs Performed by: anesthesiologist   Preanesthetic Checklist Completed: patient identified, site marked, surgical consent, pre-op evaluation, timeout performed, IV checked, risks and benefits discussed and monitors and equipment checked  Epidural Patient position: sitting Prep: site prepped and draped and DuraPrep Patient monitoring: continuous pulse ox and blood pressure Approach: midline Injection technique: LOR air  Needle:  Needle type: Tuohy  Needle gauge: 17 G Needle length: 9 cm Needle insertion depth: 7 cm Catheter type: closed end flexible Catheter size: 19 Gauge Catheter at skin depth: 12 cm Test dose: negative and Other  Assessment Sensory level: T9 Events: blood not aspirated, injection not painful, no injection resistance, negative IV test and no paresthesia

## 2012-02-28 NOTE — Progress Notes (Signed)
Patient ID: Haley Owens, female   DOB: 12-12-1975, 36 y.o.   MRN: [id number] 7 AM this morning patient was 4 cm dilated molded to a -1 station she's been in adequate labor all day and that her cervix is 24 cm the bone molding so she is be delivered by C-section because of failure to progress blood sugar was 89

## 2012-02-28 NOTE — Transfer of Care (Signed)
Immediate Anesthesia Transfer of Care Note  Patient: Haley Owens  Procedure(s) Performed: Procedure(s) (LRB): CESAREAN SECTION (N/A)  Patient Location: PACU  Anesthesia Type: Epidural  Level of Consciousness: awake, alert  and oriented  Airway & Oxygen Therapy: Patient Spontanous Breathing  Post-op Assessment: Report given to PACU RN and Post -op Vital signs reviewed and stable  Post vital signs: stable  Complications: No apparent anesthesia complications

## 2012-02-28 NOTE — Anesthesia Preprocedure Evaluation (Addendum)
Anesthesia Evaluation  Patient identified by MRN, date of birth, ID band Patient awake    Reviewed: Allergy & Precautions, H&P , NPO status , Patient's Chart, lab work & pertinent test results  Airway Mallampati: II TM Distance: >3 FB Neck ROM: full    Dental No notable dental hx.    Pulmonary neg pulmonary ROS,    Pulmonary exam normal       Cardiovascular negative cardio ROS      Neuro/Psych negative neurological ROS  negative psych ROS   GI/Hepatic negative GI ROS, Neg liver ROS,   Endo/Other  Morbid obesity  Renal/GU negative Renal ROS  negative genitourinary   Musculoskeletal negative musculoskeletal ROS (+)   Abdominal (+) + obese,   Peds negative pediatric ROS (+)  Hematology negative hematology ROS (+)   Anesthesia Other Findings   Reproductive/Obstetrics (+) Pregnancy                           Anesthesia Physical Anesthesia Plan  ASA: III  Anesthesia Plan: Epidural   Post-op Pain Management:    Induction:   Airway Management Planned:   Additional Equipment:   Intra-op Plan:   Post-operative Plan:   Informed Consent: I have reviewed the patients History and Physical, chart, labs and discussed the procedure including the risks, benefits and alternatives for the proposed anesthesia with the patient or authorized representative who has indicated his/her understanding and acceptance.     Plan Discussed with:   Anesthesia Plan Comments:         Anesthesia Quick Evaluation  

## 2012-02-28 NOTE — Anesthesia Postprocedure Evaluation (Signed)
Anesthesia Post Note  Patient: Haley Owens  Procedure(s) Performed: Procedure(s) (LRB): CESAREAN SECTION (N/A)  Anesthesia type: Epidural  Patient location: PACU  Post pain: Pain level controlled  Post assessment: Post-op Vital signs reviewed  Last Vitals:  Filed Vitals:   02/28/12 2015  BP: 117/55  Pulse: 101  Temp:   Resp: 22    Post vital signs: Reviewed  Level of consciousness: awake  Complications: No apparent anesthesia complications

## 2012-02-28 NOTE — Progress Notes (Signed)
Dr Gaynell Face notified of late decels noted since 1150 and mvu's adequate since 1030, pitocin at 18mu, -orders received to d/c pitocin and restart in 1 hour.

## 2012-02-28 NOTE — Progress Notes (Signed)
Patient ID: Haley Owens, female   DOB: [birth year], 36 y.o.   MRN: [id number] Vital signs normal Patient is now 4 cm 90% and the vertex -1 to - station she has an IUPC N. And she is on 4 milliunits of Pitocin having adequate labor

## 2012-02-28 NOTE — Op Note (Signed)
preop diagnosis gestational diabetes at 39 weeks brought in for induction failure to progress in labor post  Postop diagnosis the same Surgeon Dr. Francoise Ceo Anesthesia epidural procedure patient placed in the operating table in the supine position abdomen prepped and draped data entered with a Foley catheter a transverse suprapubic incision made carried down to the fascia fascia cleaned and incised the length of the incision recti muscles retracted laterally peritoneum incised longitudinally transverse incision made on the visceroperitoneum above the bladder and the bladder mobilized inferiorly a transverse low uterine incision made the patient delivered from the OP position of a female Apgar 8 and 9 the team was in attendance the placenta was posterior removed manually and sent to labor and delivery  Uterine cavity clean with dry laps uterine incision closed in one layer with continuous within normal on chromic hemostasis satisfactory bladder flap reattached to a chromic uterus well contracted tubes and ovaries normal abdomen chosen as peritoneum continuous with 2-0 chromic fascia continuous within of 0 Dexon and the skin shows a subcuticular stitch of 4-0 Monocryl blood loss 600 cc patient tolerated procedure well

## 2012-02-29 ENCOUNTER — Encounter (HOSPITAL_COMMUNITY): Payer: Self-pay

## 2012-02-29 LAB — CBC
Hemoglobin: 7.9 g/dL — ABNORMAL LOW (ref 12.0–15.0)
MCHC: 31.5 g/dL (ref 30.0–36.0)
Platelets: 135 10*3/uL — ABNORMAL LOW (ref 150–400)
RDW: 15 % (ref 11.5–15.5)

## 2012-02-29 MED ORDER — OXYTOCIN 40 UNITS IN LACTATED RINGERS INFUSION - SIMPLE MED
INTRAVENOUS | Status: AC
  Filled 2012-02-29: qty 1000

## 2012-02-29 NOTE — Progress Notes (Signed)
Patient ID: Haley Owens, female   DOB: February 10, 1976, 36 y.o.   MRN: [id number] Postop day 1 Vital signs normal Fundus firm Incision clean and dry No complaints

## 2012-02-29 NOTE — Anesthesia Postprocedure Evaluation (Signed)
  Anesthesia Post-op Note  Patient: Haley Owens  Procedure(s) Performed: Procedure(s) (LRB): CESAREAN SECTION (N/A)  Patient Location: PACU and Mother/Baby  Anesthesia Type: Epidural  Level of Consciousness: awake, alert  and oriented  Airway and Oxygen Therapy: Patient Spontanous Breathing    Post-op Assessment: Patient's Cardiovascular Status Stable and Respiratory Function Stable  Post-op Vital Signs: stable  Complications: No apparent anesthesia complications

## 2012-02-29 NOTE — Anesthesia Postprocedure Evaluation (Signed)
  Anesthesia Post-op Note  Patient: Haley Owens  Procedure(s) Performed: Procedure(s) (LRB): CESAREAN SECTION (N/A)  Patient Location: PACU and Mother/Baby  Anesthesia Type: Epidural  Level of Consciousness: awake, alert , oriented and patient cooperative  Airway and Oxygen Therapy: Patient Spontanous Breathing  Post-op Pain: none  Post-op Assessment: Post-op Vital signs reviewed  Post-op Vital Signs: Reviewed and stable  Complications: No apparent anesthesia complications

## 2012-03-01 ENCOUNTER — Encounter (HOSPITAL_COMMUNITY): Payer: Self-pay | Admitting: Obstetrics

## 2012-03-01 NOTE — Discharge Summary (Signed)
Obstetric Discharge Summary Reason for Admission: induction of labor Prenatal Procedures: none Intrapartum Procedures: cesarean: low cervical, transverse Postpartum Procedures: none Complications-Operative and Postpartum: none Hemoglobin  Date Value Range Status  02/29/2012 7.9* 12.0 - 15.0 g/dL Final     REPEATED TO VERIFY     DELTA CHECK NOTED     HCT  Date Value Range Status  02/29/2012 25.1* 36.0 - 46.0 % Final    Physical Exam:  General: alert Lochia: appropriate Uterine Fundus: firm Incision: healing well DVT Evaluation: No evidence of DVT seen on physical exam.  Discharge Diagnoses: Term Pregnancy-delivered  Discharge Information: Date: 03/01/2012 Activity: pelvic rest Diet: routine Medications: Percocet Condition: stable Instructions: refer to practice specific booklet Discharge to: home Follow-up Information    Follow up with Yohance Hathorne A, MD. Call in 6 weeks.   Contact information:   [address] [place] [address] [phone]          Newborn Data: Live born female  Birth Weight: 7 lb 1.1 oz (3205 g) APGAR: 8, 9  Home with mother.  Wynonna Fitzhenry A 03/01/2012, 7:12 AM

## 2012-03-01 NOTE — Progress Notes (Signed)
Patient ID: Haley Owens, female   DOB: February 04, 1976, 36 y.o.   MRN: [id number] Patient desires early discharge so she was sent home today

## 2012-03-01 NOTE — Progress Notes (Signed)
Patient ID: Haley Owens, female   DOB: [birth year], 36 y.o.   MRN: [id number] Postop day 2 Vital signs normal Incision clean and dry Legs negative No complaints

## 2012-08-21 ENCOUNTER — Other Ambulatory Visit: Payer: Self-pay

## 2013-09-17 IMAGING — US US OB NUCHAL TRANSLUCENCY 1ST GEST
1 series · 14 of 27 positions shown · non-contrast
Comparison: none

[Series 1: us ob nuchal translucency 1st gest · 14 of 27 slices shown]
[im 1/27]
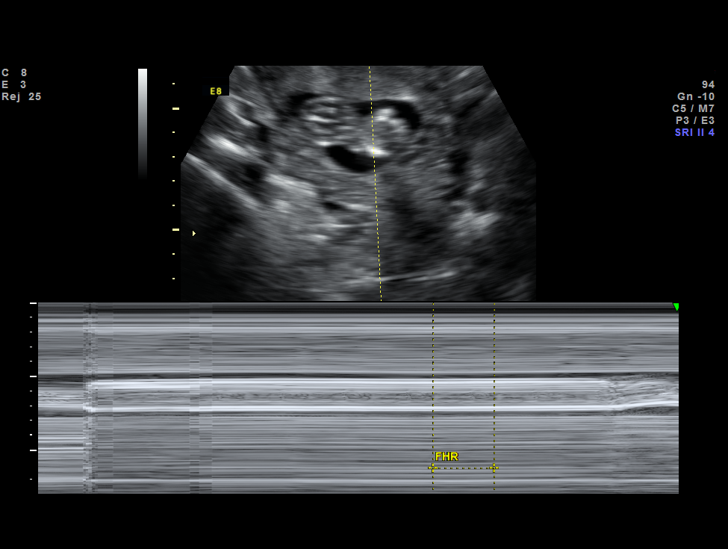
[im 3/27]
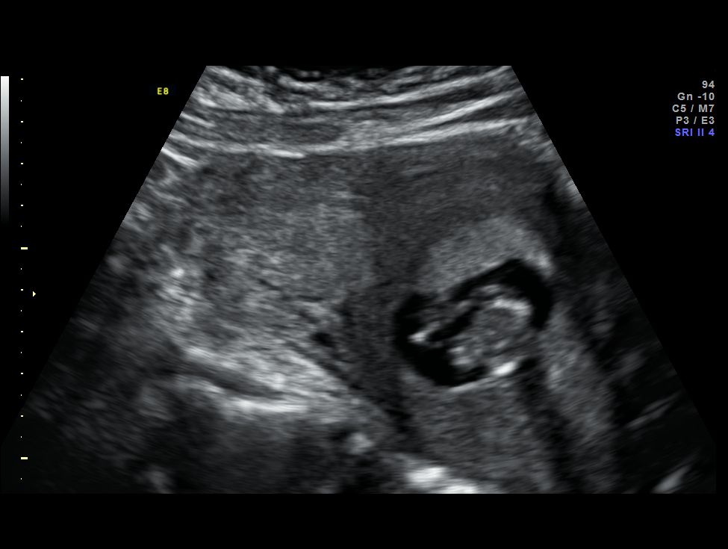
[im 5/27]
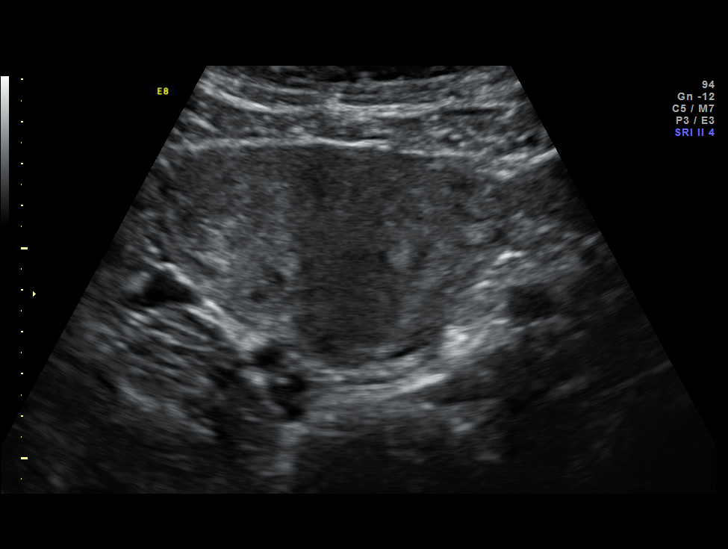
[im 7/27]
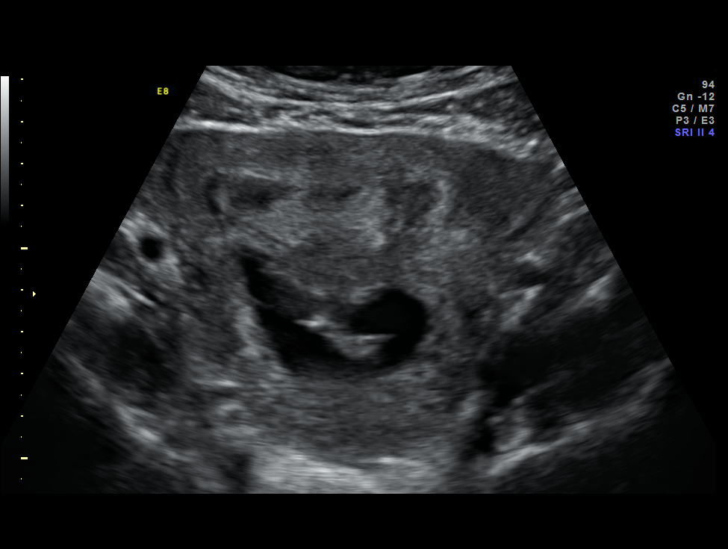
[im 9/27]
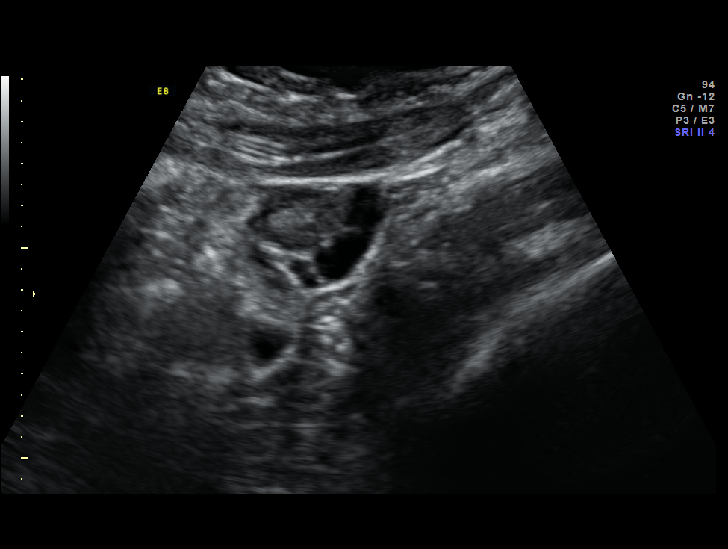
[im 11/27]
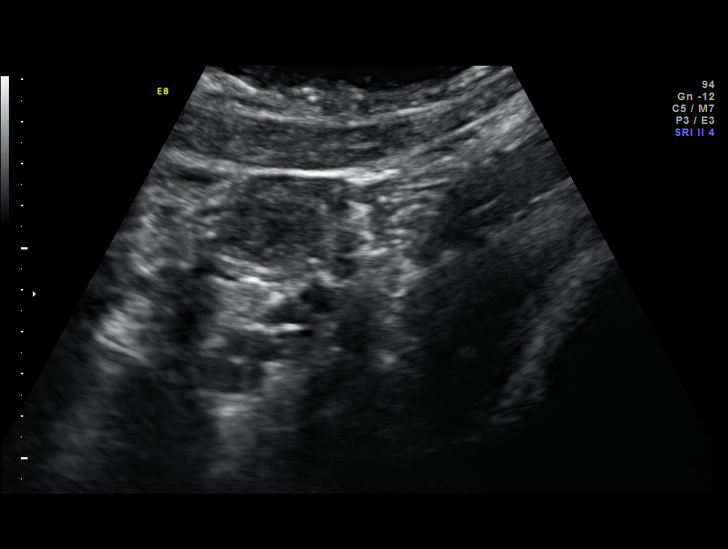
[im 13/27]
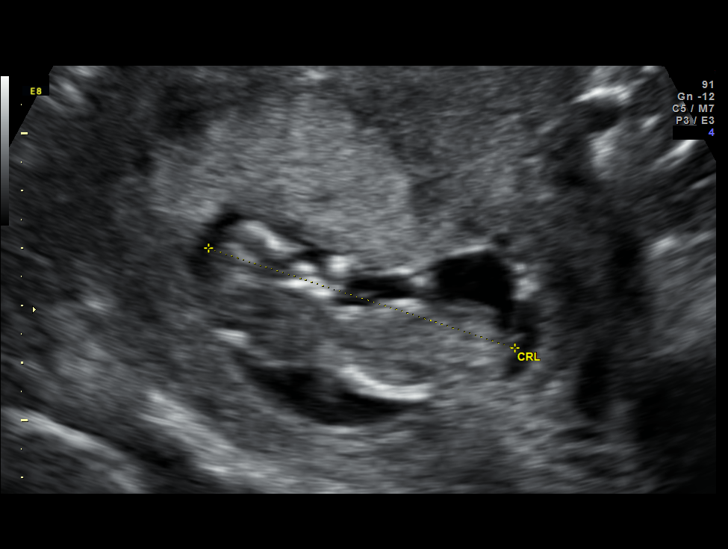
[im 15/27]
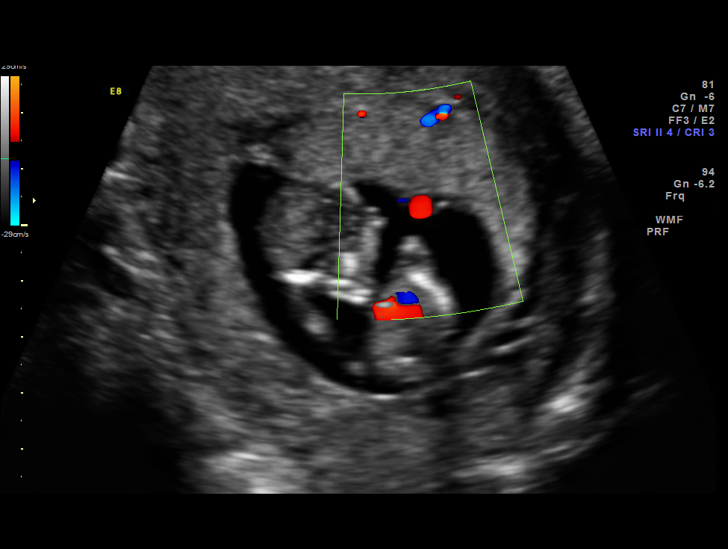
[im 17/27]
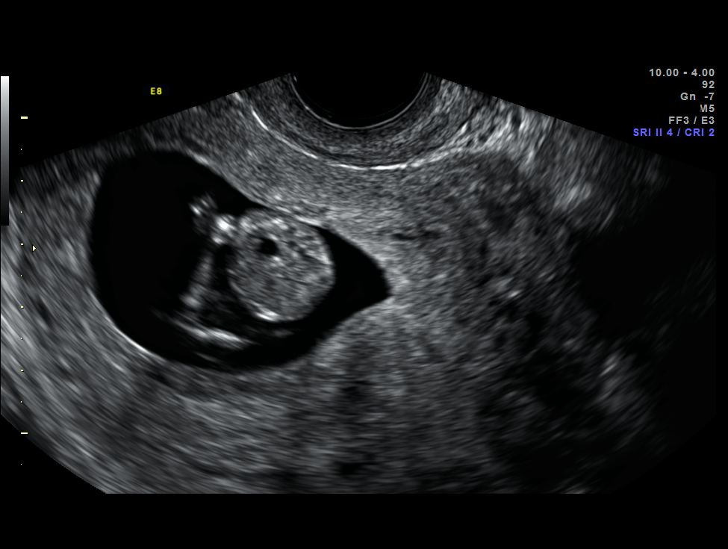
[im 19/27]
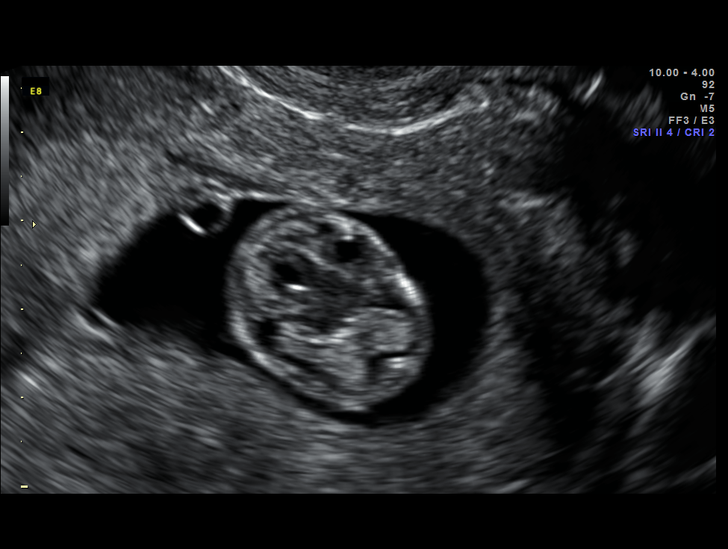
[im 21/27]
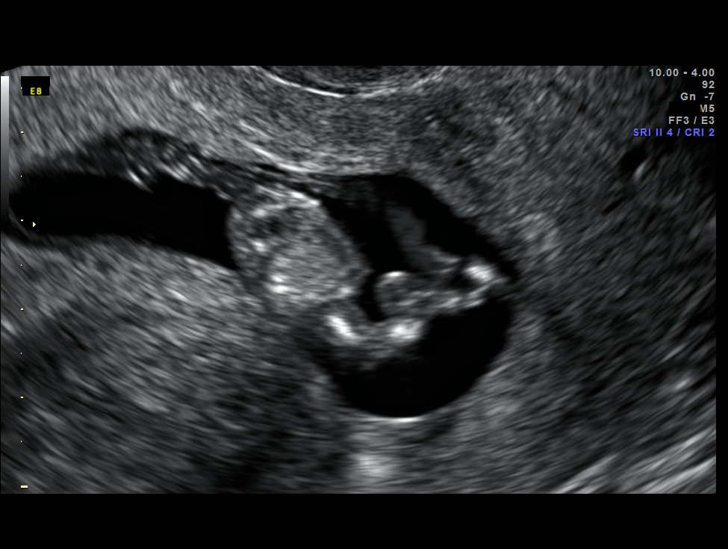
[im 23/27]
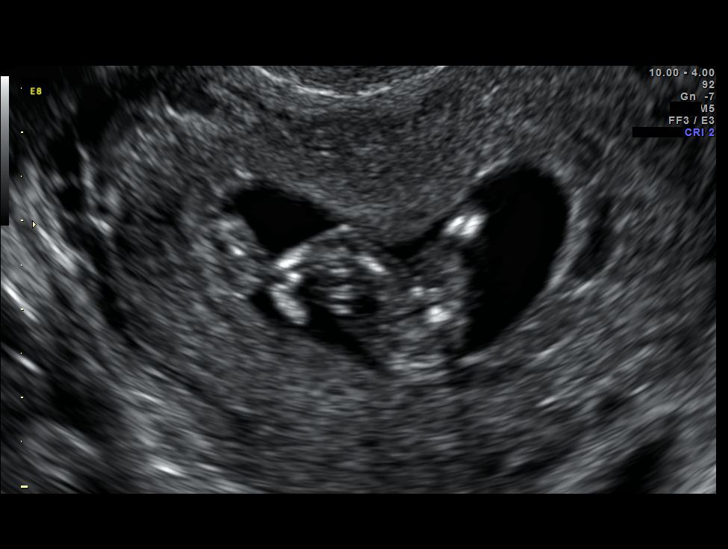
[im 25/27]
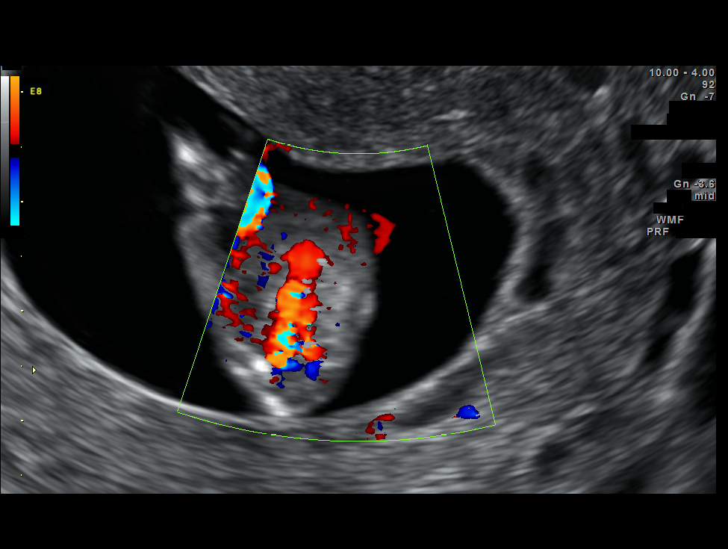
[im 27/27]
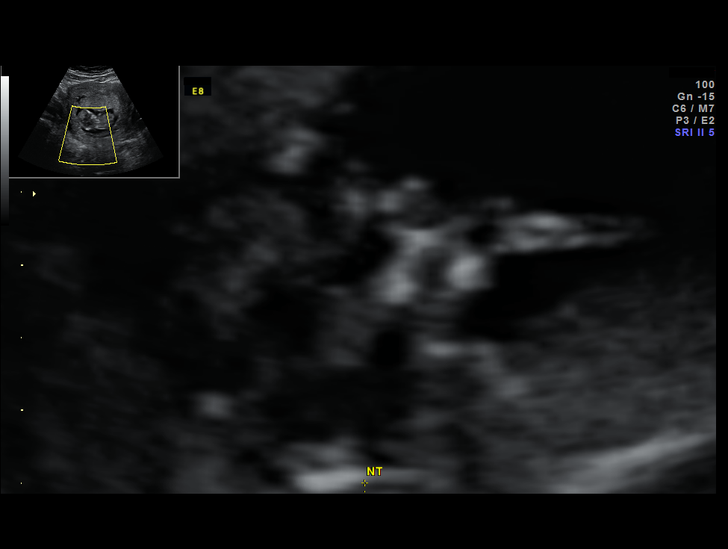

[14 of 27 positions shown; findings below may reference images not displayed]

Canned report from images found in remote index.

Refer to host system for actual result text.

## 2013-11-10 IMAGING — US US OB DETAIL+14 WK
1 series · 14 of 28 positions shown · non-contrast
Comparison: none

[Series 1: us ob detail+14 wk · 0.19mm/px · 14 of 115 slices shown]
[im 5/115]
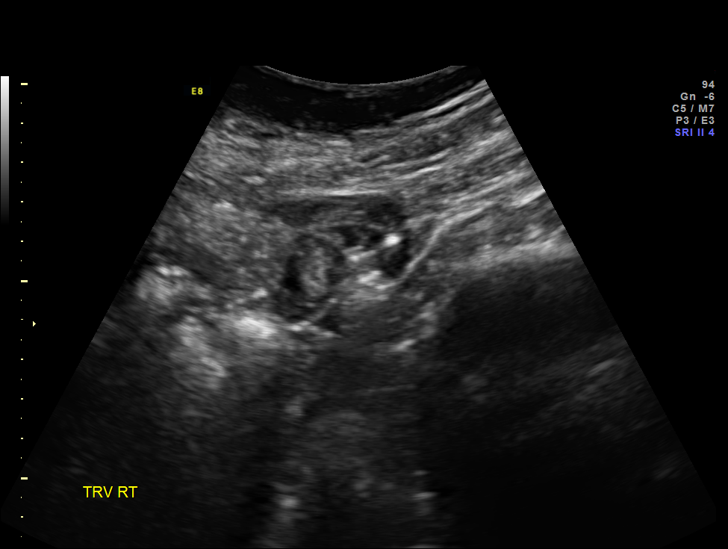
[im 13/115]
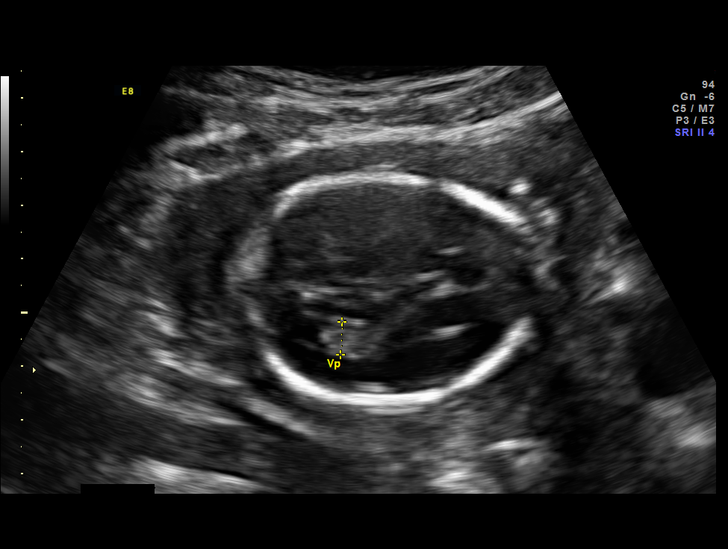
[im 22/115]
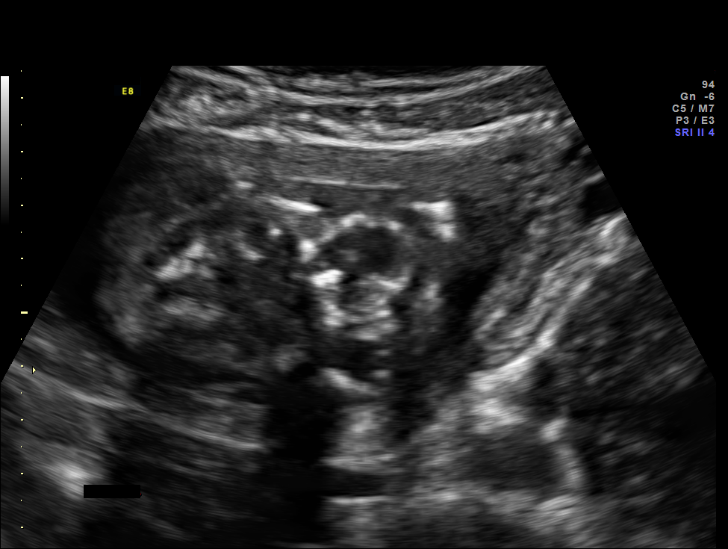
[im 30/115]
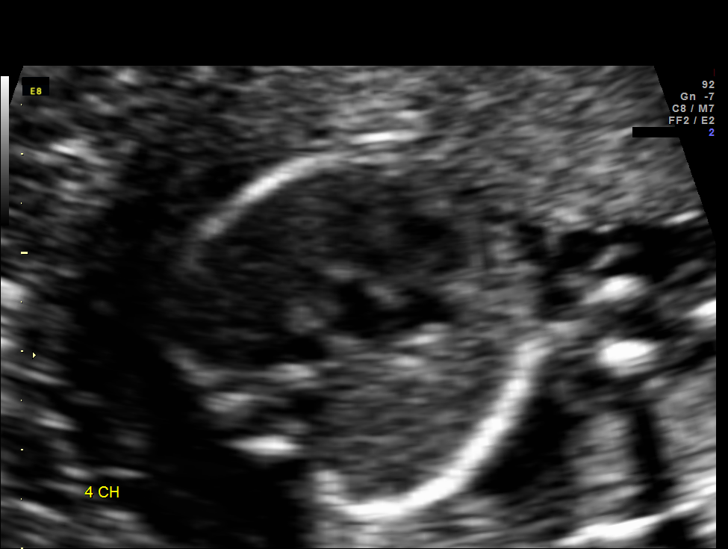
[im 39/115]
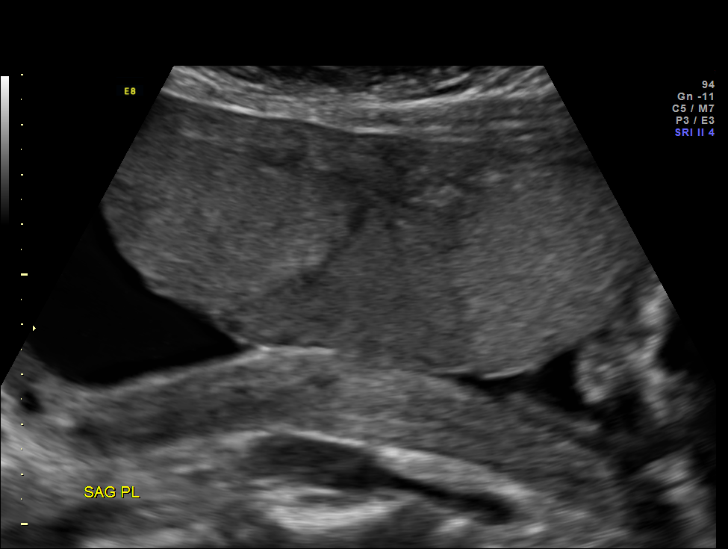
[im 47/115]
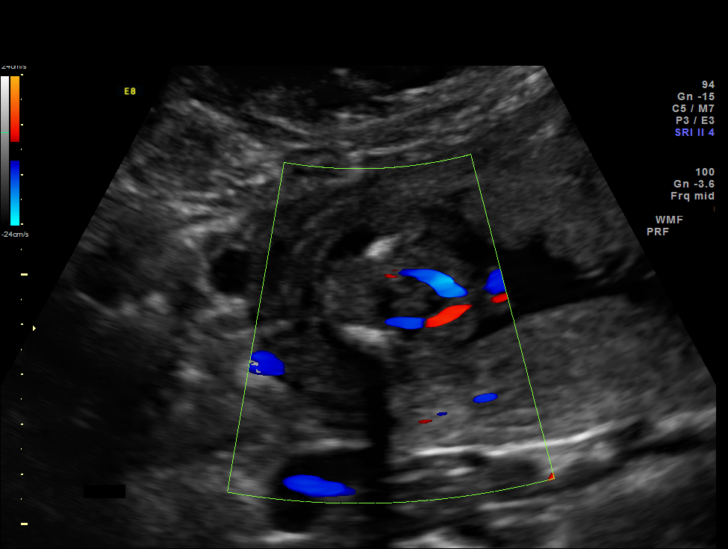
[im 55/115]
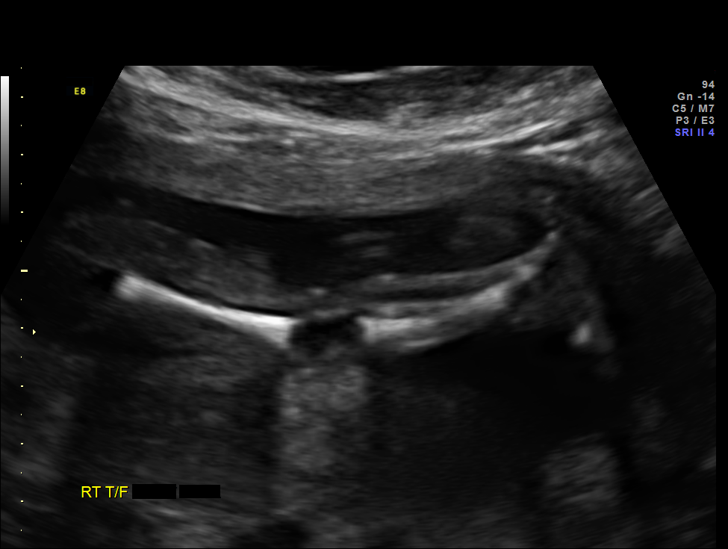
[im 64/115]
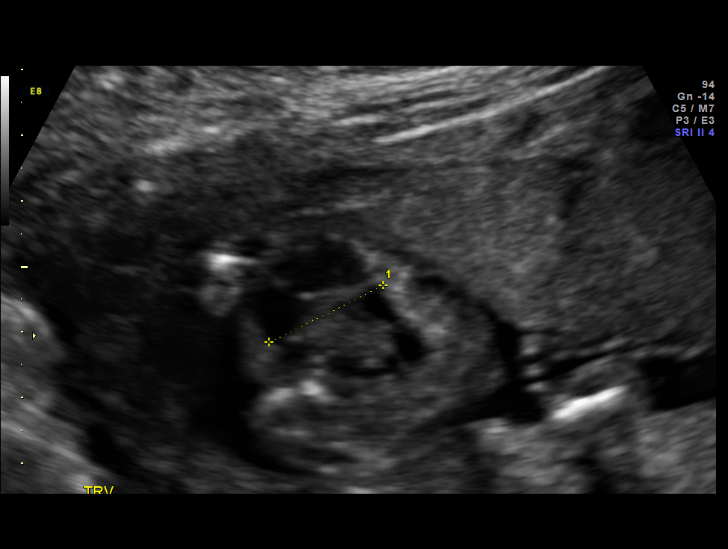
[im 72/115]
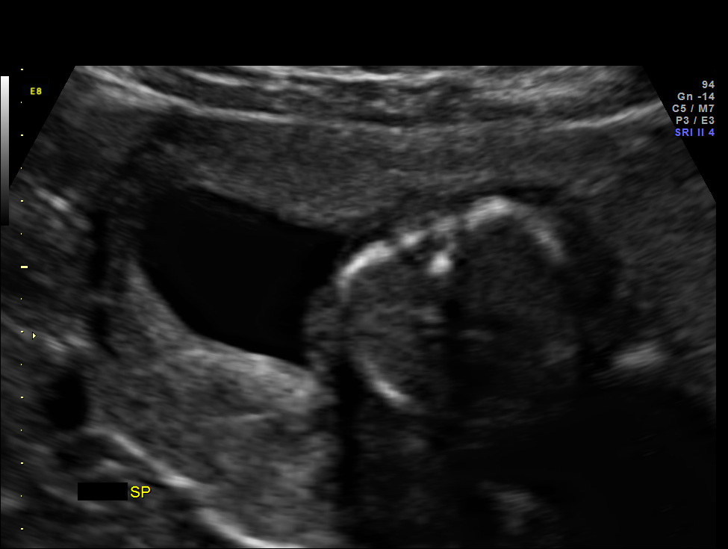
[im 81/115]
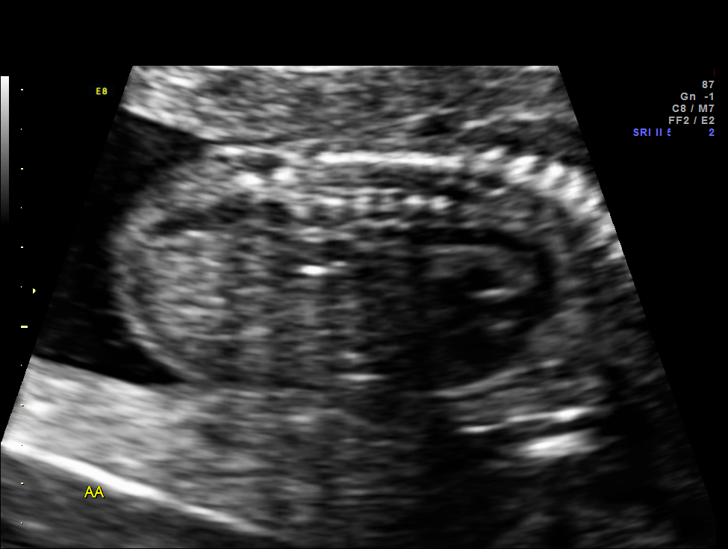
[im 89/115]
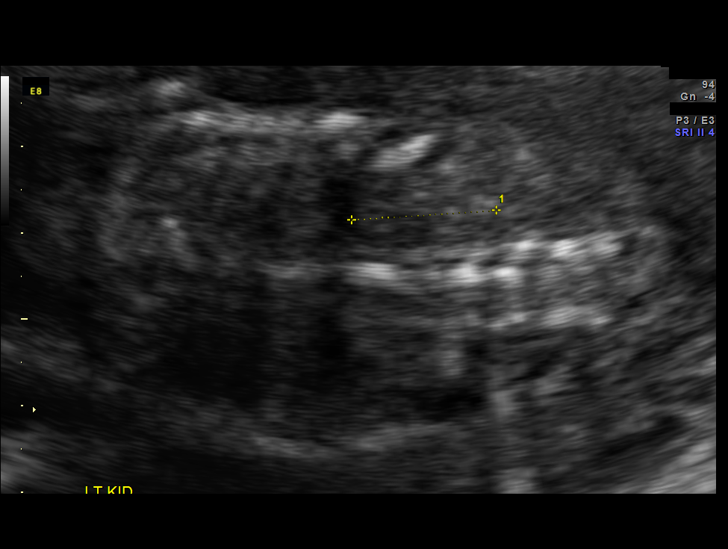
[im 98/115]
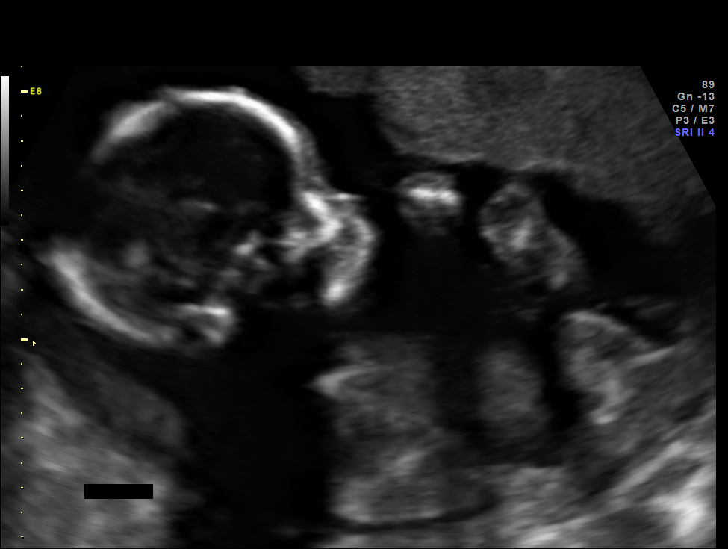
[im 106/115]
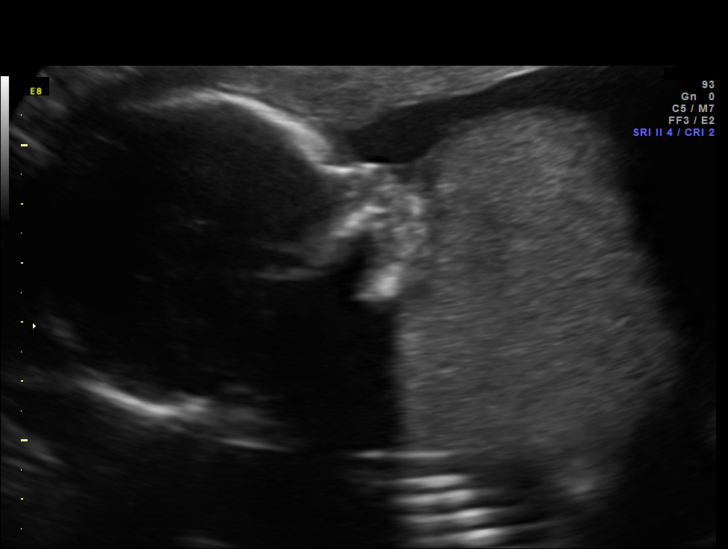
[im 115/115]
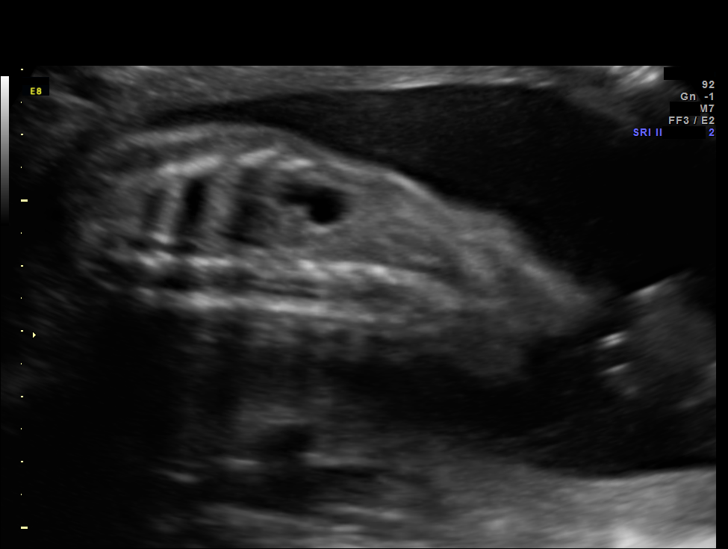

[14 of 28 positions shown; findings below may reference images not displayed]

Canned report from images found in remote index.

Refer to host system for actual result text.

## 2013-11-30 IMAGING — US US OB FOLLOW-UP
1 series · 14 of 28 positions shown · non-contrast
Comparison: none

[Series 1: us ob follow-up · 0.26mm/px · 14 of 73 slices shown]
[im 3/73]
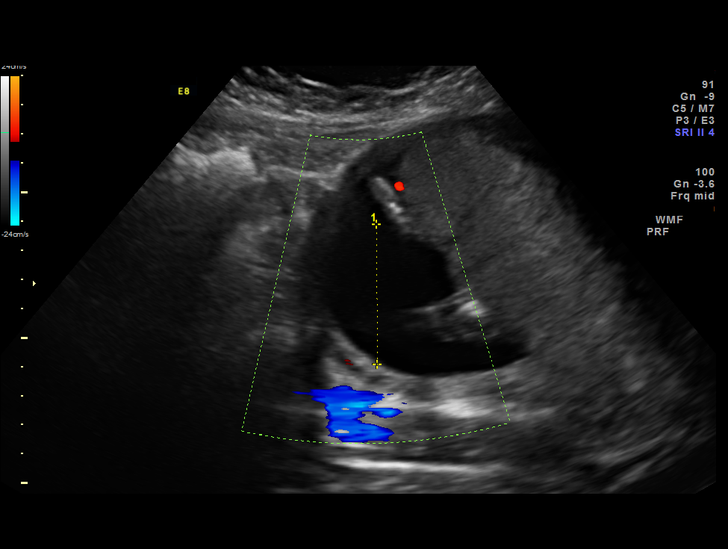
[im 9/73]
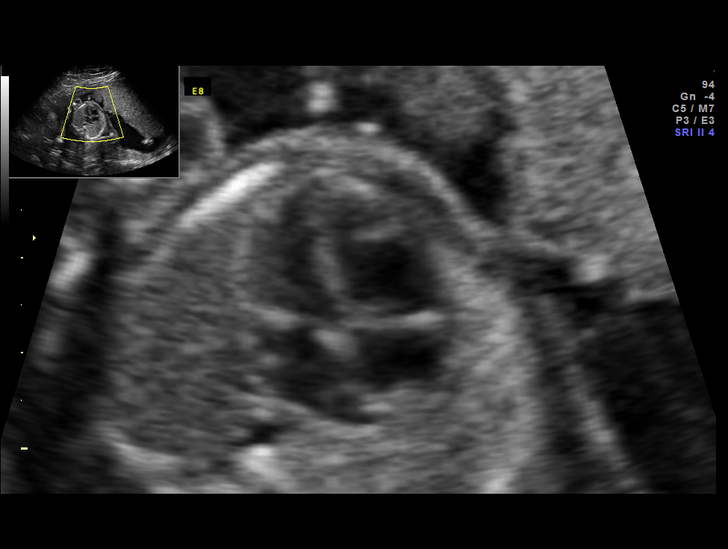
[im 14/73]
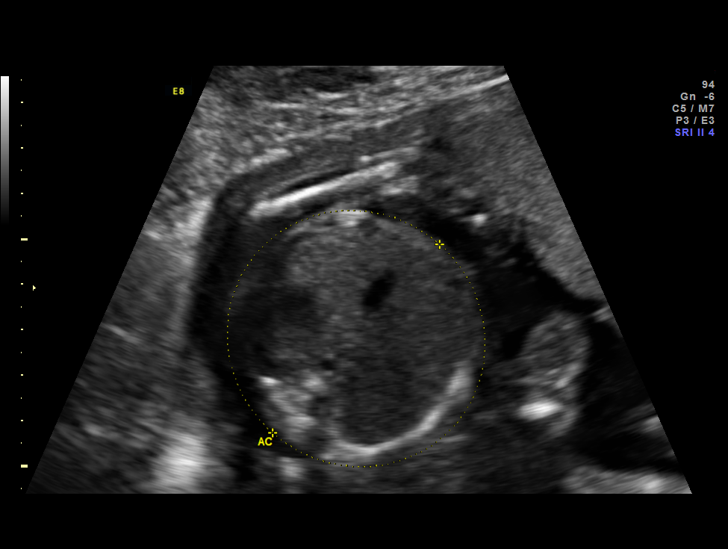
[im 19/73]
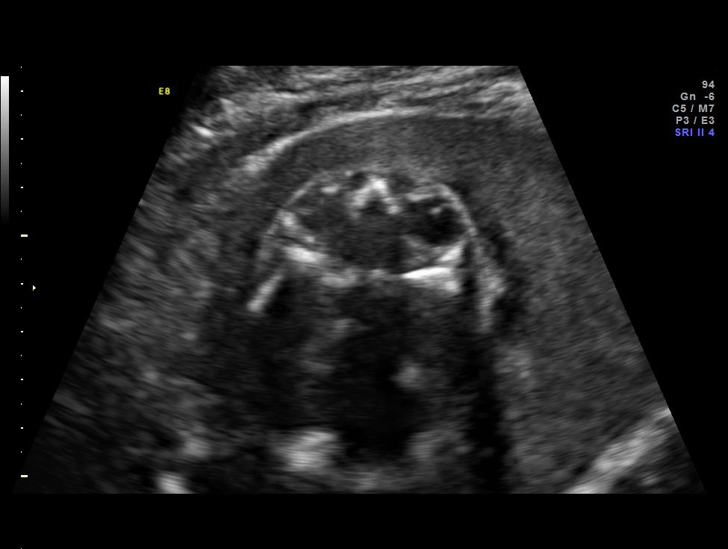
[im 25/73]
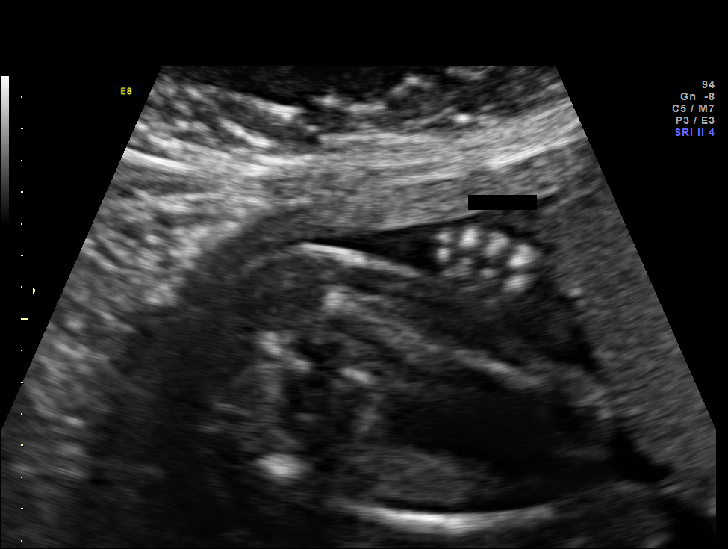
[im 30/73]
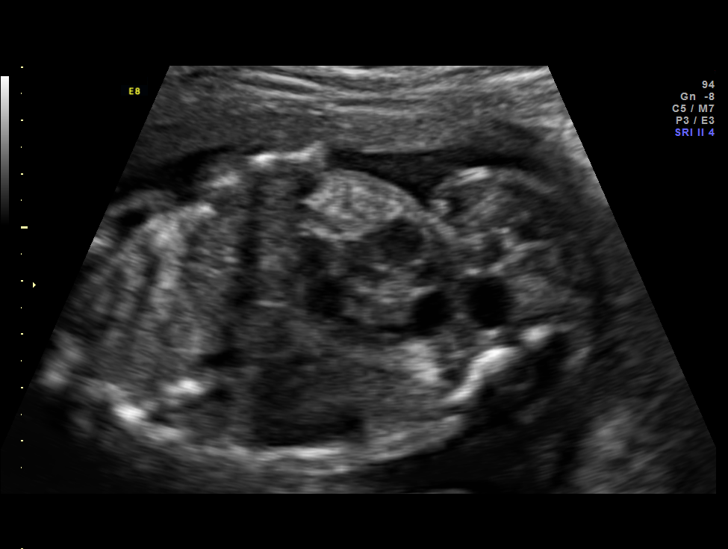
[im 35/73]
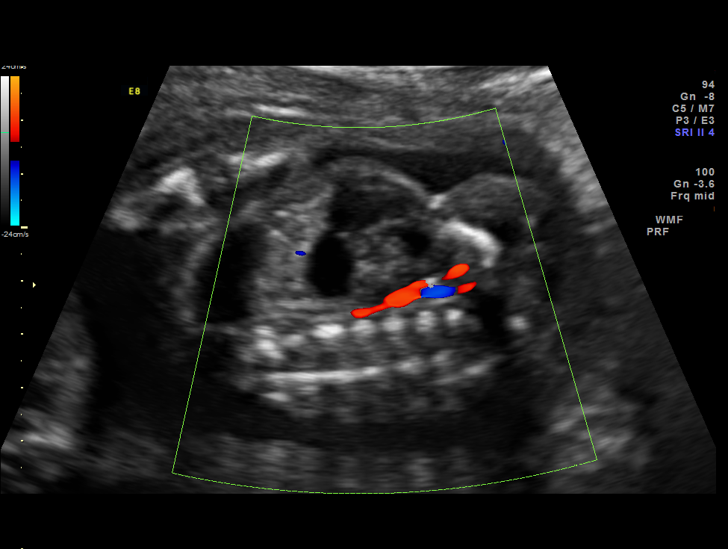
[im 41/73]
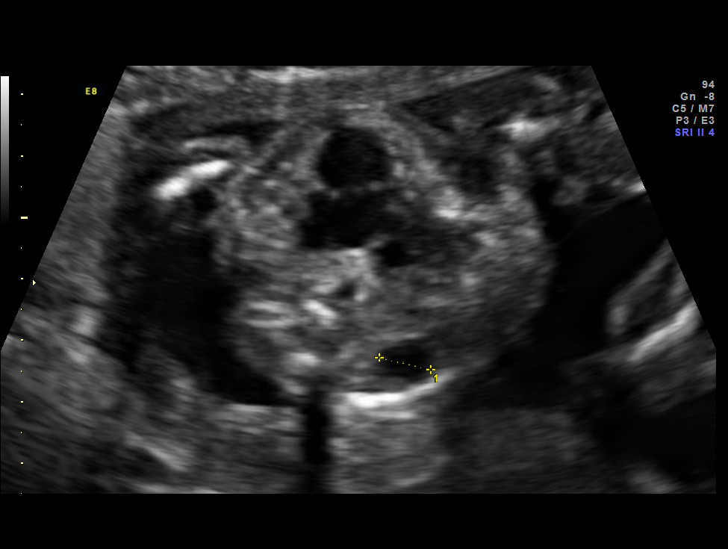
[im 46/73]
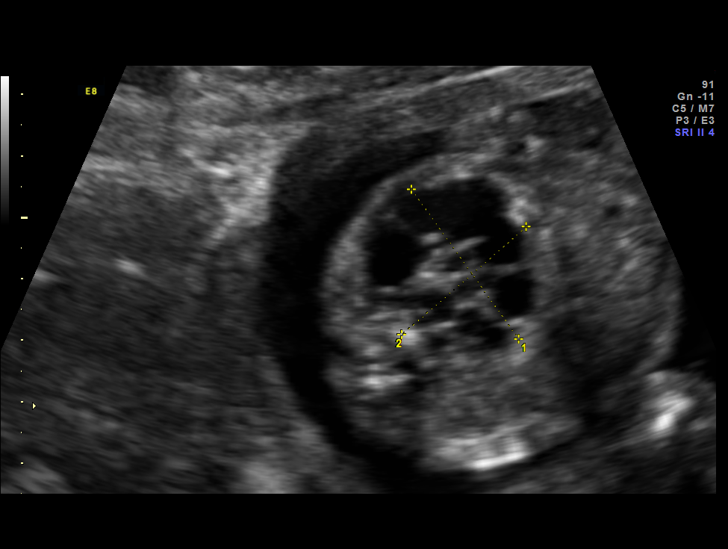
[im 51/73]
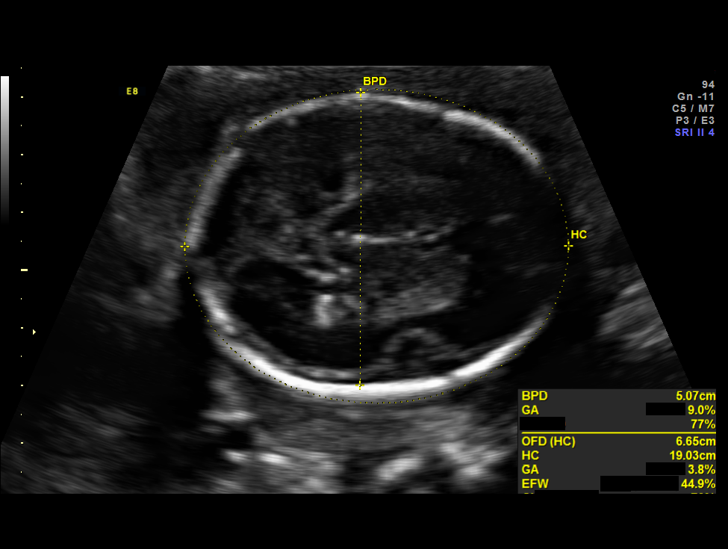
[im 57/73]
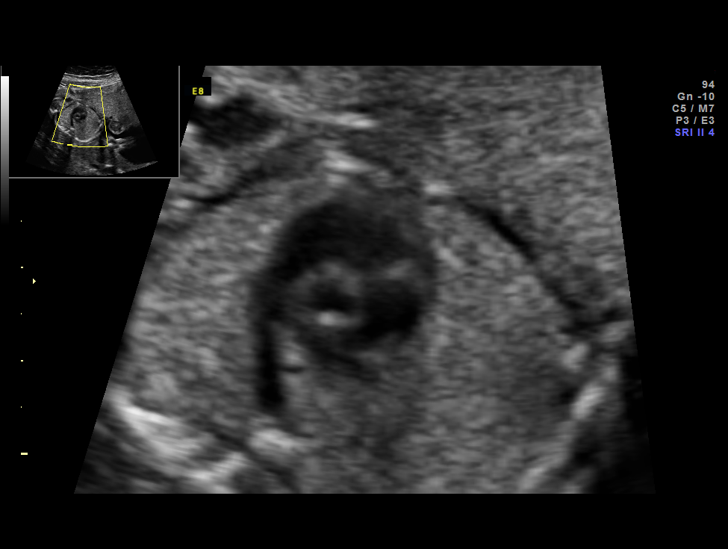
[im 62/73]
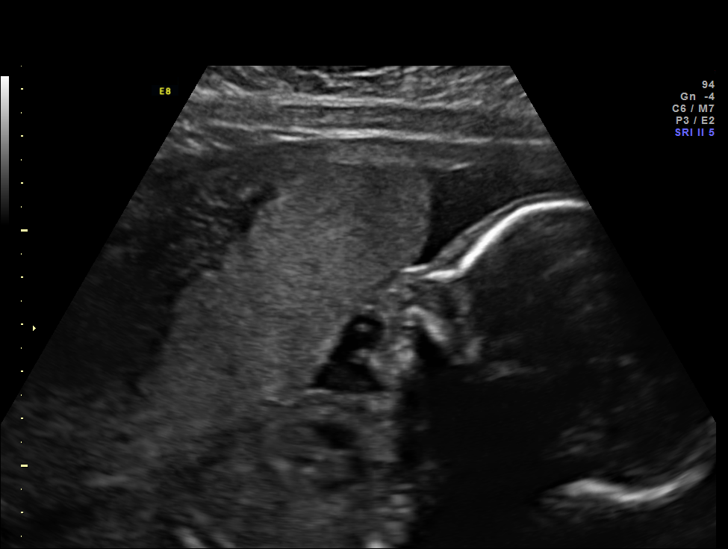
[im 67/73]
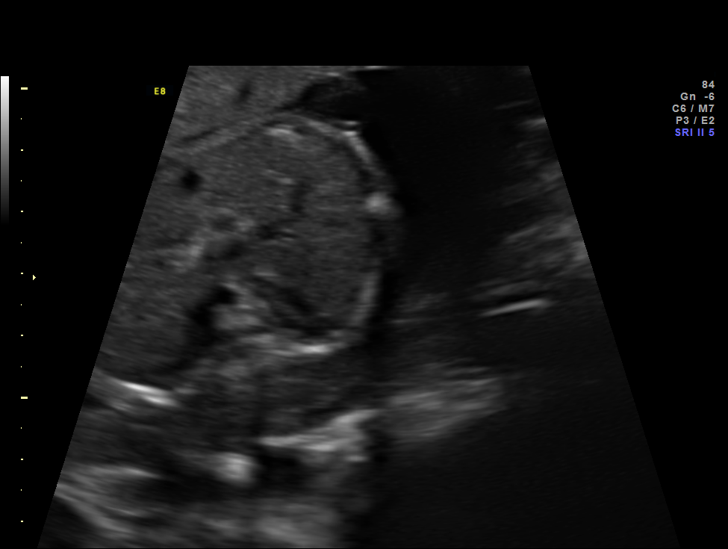
[im 73/73]
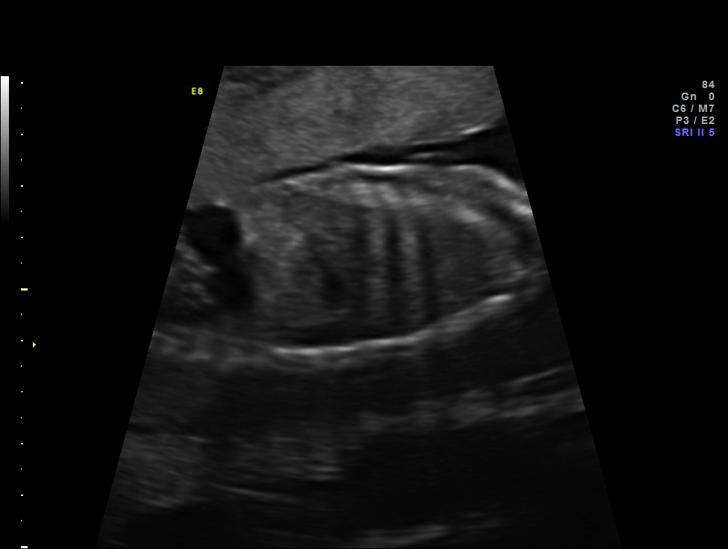

[14 of 28 positions shown; findings below may reference images not displayed]

Canned report from images found in remote index.

Refer to host system for actual result text.

## 2013-12-28 IMAGING — US US OB FOLLOW-UP
1 series · 12 of 28 positions shown · non-contrast
Comparison: none

[Series 1: us ob follow-up · 0.25mm/px · 62 acquisitions, 12 frames shown]
[im 3/62]
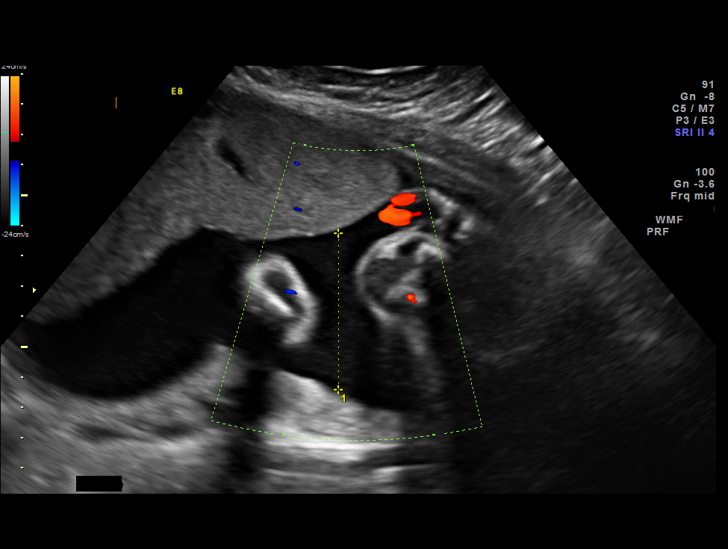
[im 7/62]
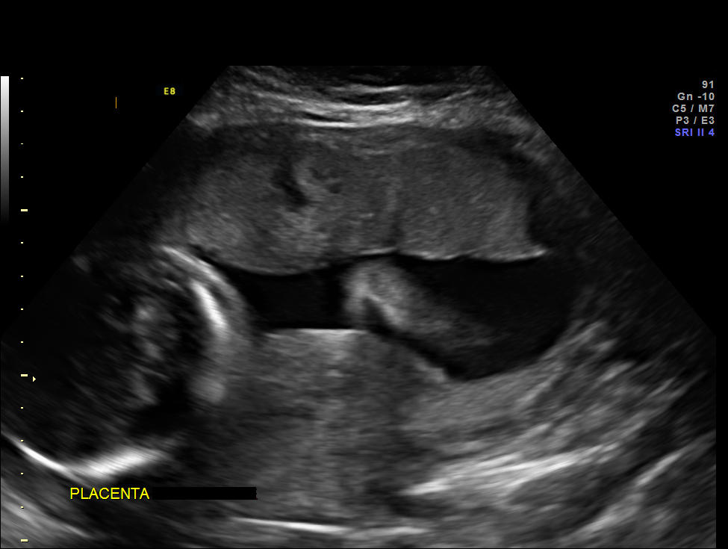
[im 12/62]
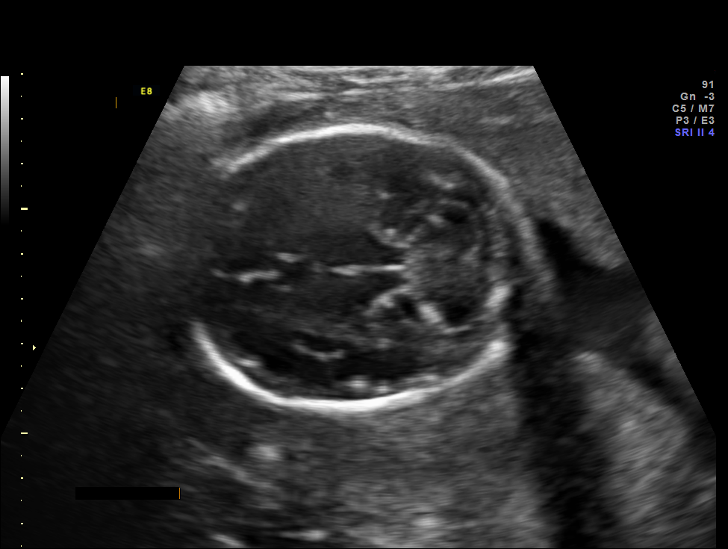
[im 19/62]
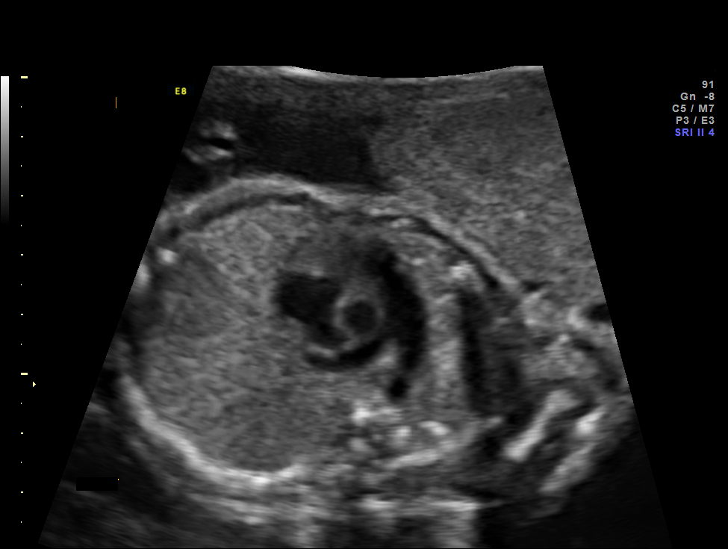
[im 23/62]
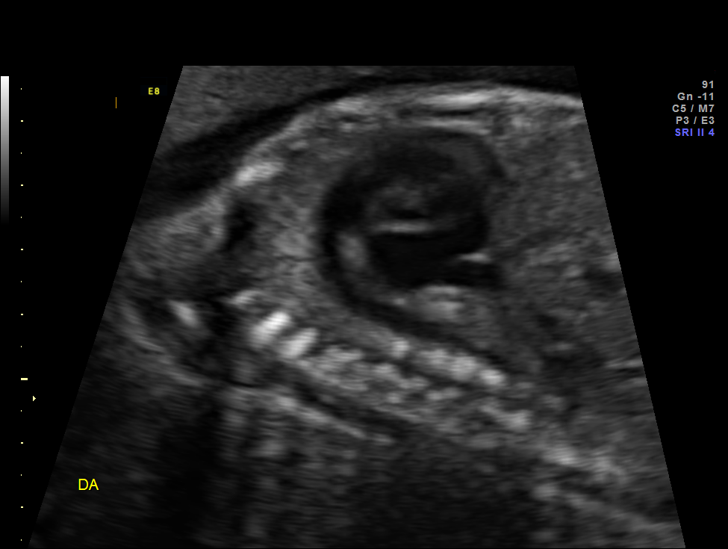
[im 28/62]
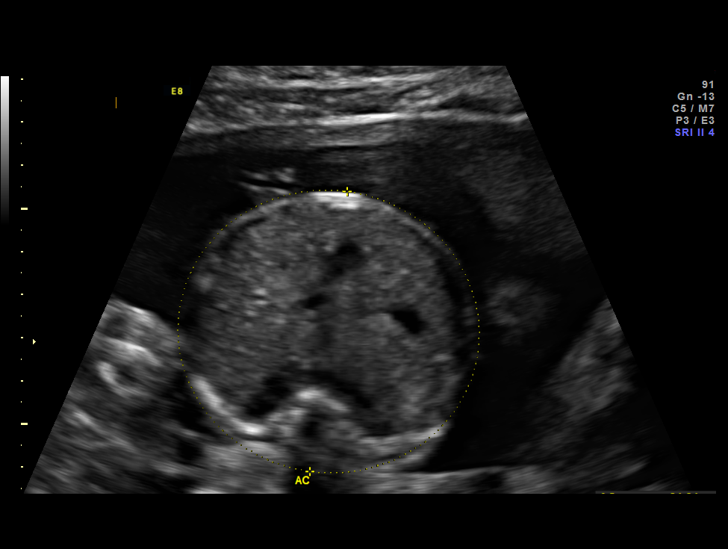
[im 34/62]
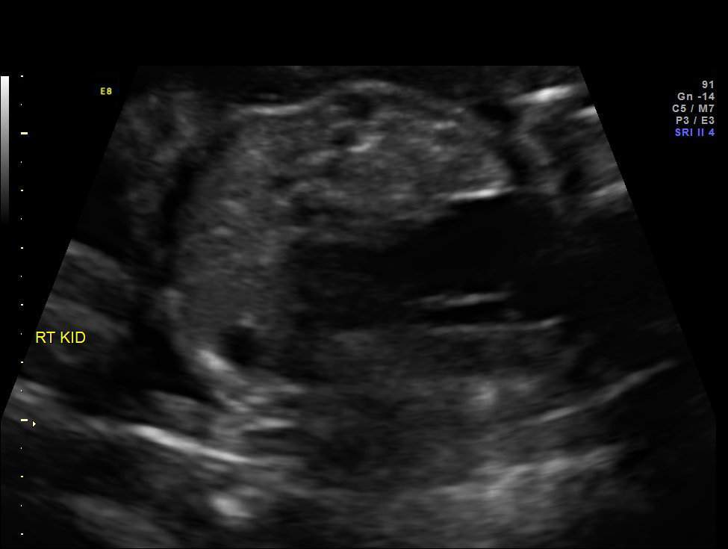
[im 39/62]
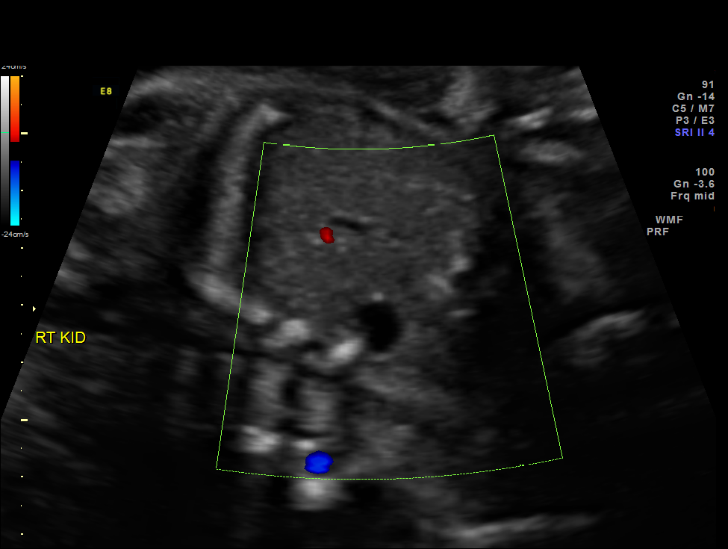
[im 43/62]
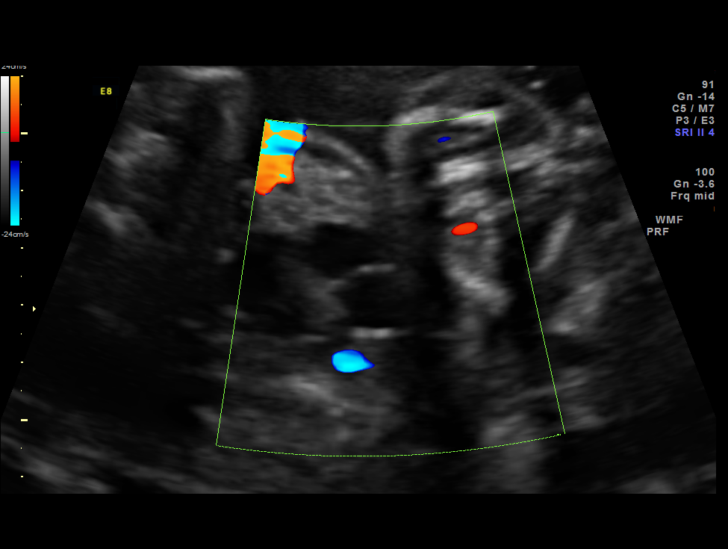
[im 50/62]
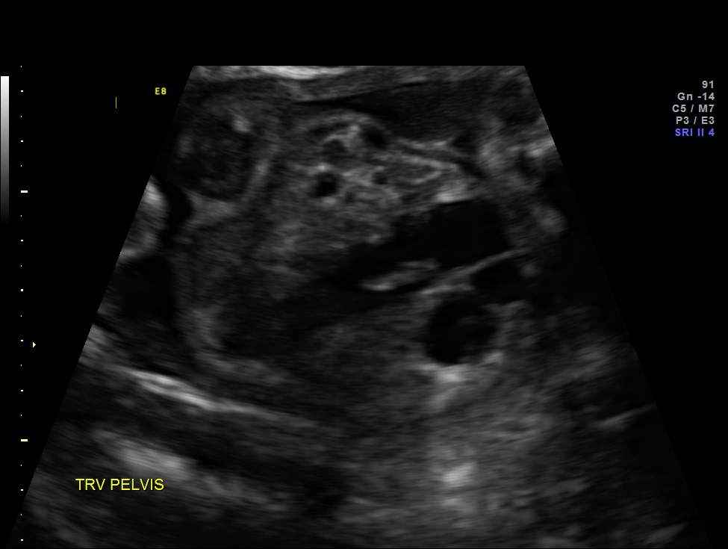
[im 55/62]
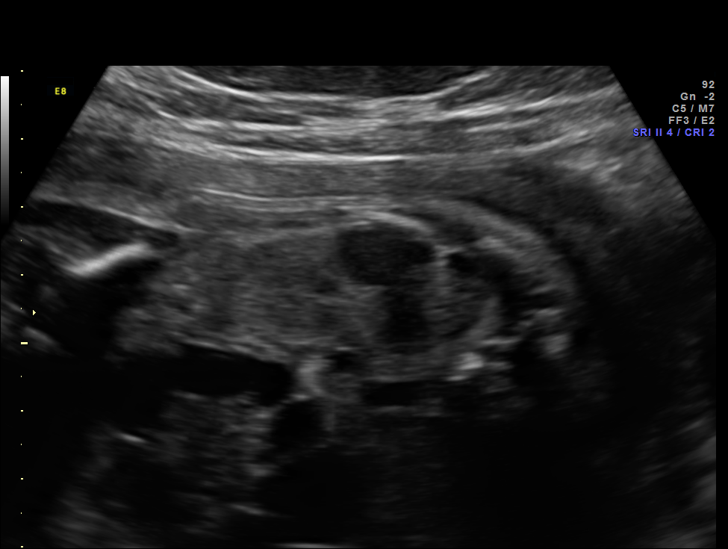
[im 59/62]
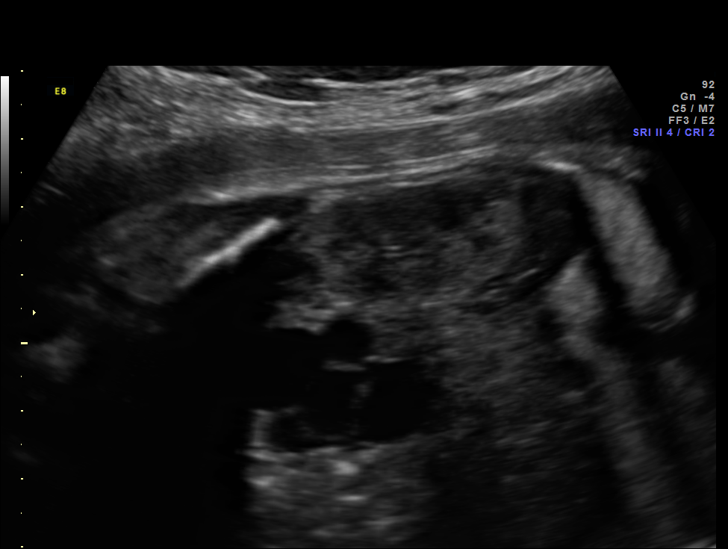

[12 of 28 positions shown; findings below may reference images not displayed]

OBSTETRICS REPORT
                      (Signed Final 12/02/2011 [DATE])

 Order#:         [id number]
Procedures

 US OB FOLLOW UP                                       76816.1
Indications

 Fetal abnormality - other known or suspected:
 pelvic MCDK
 Advanced maternal age (AMA), Multigravida
 Poor obstetric history-Recurrent (habitual) abortion
 (3 consecutive ab's)
 Assess Fetal Growth / Estimated Fetal Weight
Fetal Evaluation

 Fetal Heart Rate:  142                          bpm
 Cardiac Activity:  Observed
 Presentation:      Breech
 Placenta:          Anterior, above cervical os
 P. Cord            Previously Visualized
 Insertion:

 Amniotic Fluid
 AFI FV:      Subjectively within normal limits
                                             Larg Pckt:     5.1  cm
Biometry

 BPD:     63.2  mm     G. Age:  25w 4d                CI:         73.8   70 - 86
 OFD:     85.6  mm                                    FL/HC:      20.0   18.6 -

 HC:     237.8  mm     G. Age:  25w 6d        9  %    HC/AC:      1.11   1.05 -

 AC:     214.5  mm     G. Age:  26w 0d       24  %    FL/BPD:     75.3   71 - 87
 FL:      47.6  mm     G. Age:  26w 0d       19  %    FL/AC:      22.2   20 - 24
 HUM:     46.4  mm     G. Age:  27w 2d       64  %
 CER:       30  mm     G. Age:  26w 4d       48  %

 Est. FW:     867  gm    1 lb 15 oz      39  %
Gestational Age

 LMP:           27w 4d        Date:  05/23/11                 EDD:   02/27/12
 U/S Today:     25w 6d                                        EDD:   03/10/12
 Best:          26w 4d     Det. By:  U/S C R L (08/22/11)     EDD:   03/05/12
Anatomy

 Cranium:           Appears normal      Aortic Arch:       Previously seen
 Fetal Cavum:       Appears normal      Ductal Arch:       Previously seen
 Ventricles:        Appears normal      Diaphragm:         Appears normal
 Choroid Plexus:    Previously seen     Stomach:           Appears
                                                           normal, left
                                                           sided
 Cerebellum:        Appears normal      Abdomen:           Appears normal
 Posterior Fossa:   Previously seen     Abdominal Wall:    Previously seen
 Nuchal Fold:       Previously seen     Cord Vessels:      Previously seen
 Face:              Previously seen     Kidneys:           Abnormal, see
                                                           comments
 Heart:             Appears normal      Bladder:           Appears normal
                    (4 chamber &
                    axis)
 RVOT:              Appears normal      Spine:             Previously seen
 LVOT:              Appears normal      Limbs:             Previously seen

 Other:     Fetus appears to be a female. Heels and 5th digit
            previously seen.
Cervix Uterus Adnexa

 Cervical Length:    3        cm

 Cervix:       Normal appearance by transabdominal scan.
Impression

 IUP at 26+4 weeks
 Unilateral, multicystic dysplastic kidney on left; right kidney
 appeared normal except for one 8 mm cysts
 All other interval fetal anatomy was seen and appeared
 normal
 Normal amniotic fluid volume
 Appropriate interval growth with EFW at the 39th %tile

 Normal fetal ECHO
Recommendations

 Appt has been made for repeat US and consultation with a
 pediatric urologist on [DATE] at [facility] in [HOSPITAL]

## 2014-01-19 ENCOUNTER — Ambulatory Visit: Payer: 59

## 2014-03-15 ENCOUNTER — Ambulatory Visit (INDEPENDENT_AMBULATORY_CARE_PROVIDER_SITE_OTHER): Payer: 59 | Admitting: Internal Medicine

## 2014-03-15 ENCOUNTER — Encounter: Payer: Self-pay | Admitting: Internal Medicine

## 2014-03-15 VITALS — BP 102/66 | HR 93 | Temp 98.2°F | Ht 62.8 in | Wt 204.0 lb

## 2014-03-15 DIAGNOSIS — Z0000 Encounter for general adult medical examination without abnormal findings: Secondary | ICD-10-CM

## 2014-03-15 NOTE — Assessment & Plan Note (Signed)
Td 2012 Sees gynecology.  Not on BCP Diet and exercise discussed.   Labs

## 2014-03-15 NOTE — Progress Notes (Signed)
Subjective:    Patient ID: Haley Owens, female    DOB: 23-Jun-1976, 38 y.o.   MRN: [id number]  DOS:  03/15/2014 Type of visit - description: new pt, cpx  History: In general feeling well. Several years history of occasional "tingling, discomfort, not really pain" at the right side of the chest, symptoms last few seconds, used to have several episodes a day but now is much less frequent, symptoms did not increase or change with eating, coughing or moving her torso.  Also has pain at the posterior neck, right sided,  whenever she does  something like laughing. This is going on since 2004, not getting better , worse or more frequent.  ROS No fever, chills, rash  No  SSCP, SOB Denies  nausea, vomiting diarrhea, blood in the stools No classic  GERD  Sx.  (-) cough, sputum production (-) wheezing, chest congestion No dysuria, gross hematuria, difficulty urinating  No anxiety, depression    Past Medical History  Diagnosis Date  . Anemia     h/o  . Anxiety   . History of gonorrhea   . Diabetes mellitus     A1C 6.0 12-2009 on glyburide    Past Surgical History  Procedure Laterality Date  . Cesarean section  02/28/2012    Procedure: CESAREAN SECTION;  Surgeon: Kathreen Cosier, MD;  Location: [facility];  Service: Gynecology;  Laterality: N/A;  Primary cesarean section with delivery of baby girl at 59   APGAR 8/9    History   Social History  . Marital Status: Single    Spouse Name: N/A    Number of Children: 4  . Years of Education: N/A   Occupational History  . work w/ the [employer] office    Social History Main Topics  . Smoking status: Former Smoker -- 10 years  . Smokeless tobacco: Not on file  . Alcohol Use: Yes  . Drug Use: No  . Sexual Activity: Yes    Birth Control/ Protection: None   Other Topics Concern  . Not on file   Social History Narrative   Lives w/ boyfriend      Family History  Problem Relation Age of Onset  . Hypertension Mother   .  Hyperlipidemia Mother   . Arthritis Maternal Grandmother   . Diabetes Maternal Grandfather   . Colon cancer Neg Hx   . Breast cancer Other     aunt  . CAD Neg Hx        Medication List    Notice As of 03/15/2014 11:59 PM   You have not been prescribed any medications.         Objective:   Physical Exam BP 102/66  Pulse 93  Temp(Src) 98.2 F (36.8 C)  Ht 5' 2.8" (1.595 m)  Wt 204 lb (92.534 kg)  BMI 36.37 kg/m2  SpO2 98%  General -- alert, well-developed, NAD.  Neck --no thyromegaly ; FROM, no TTP HEENT-- Not pale.   Lungs -- normal respiratory effort, no intercostal retractions, no accessory muscle use, and normal breath sounds.  Chest wall--- no TTP Heart-- normal rate, regular rhythm, no murmur.  Abdomen-- Not distended, good bowel sounds,soft, non-tender.  Extremities-- no pretibial edema bilaterally  Neurologic--  alert & oriented X3. Speech normal, gait appropriate for age, strength symmetric and appropriate for age.  Psych-- Cognition and judgment appear intact. Cooperative with normal attention span and concentration. No anxious or depressed appearing.     Assessment & Plan:  Neck pain, right-sided chest pain: Recommend observation for now, if symptoms increase she will let me know

## 2014-03-15 NOTE — Patient Instructions (Signed)
Come back fasting for labs : FLP, CBC, CMP, TSH ---- dx  A1c --- pre diabetes   Next visit one year and as needed  If the pain in the neck or the right side of the chest get worse or more frequent : please let me know

## 2014-03-15 NOTE — Progress Notes (Signed)
Pre visit review using our clinic review tool, if applicable. No additional management support is needed unless otherwise documented below in the visit note. 

## 2014-06-26 ENCOUNTER — Encounter: Payer: Self-pay | Admitting: Internal Medicine

## 2015-02-13 ENCOUNTER — Telehealth: Payer: Self-pay | Admitting: General Practice

## 2015-02-13 NOTE — Telephone Encounter (Signed)
Called pt and lmovm to niform the need to have her sugars rechecked since she was in the pre-diabetic range a year ago. Pt also informed on VM that she is due for a CPE after 03-16-15.
